# Patient Record
Sex: Female | Born: 1970
Health system: Southern US, Community
[De-identification: ages and names within clinical notes are randomized; demographics above are authoritative.]

## PROBLEM LIST (undated history)

## (undated) DIAGNOSIS — R760 Raised antibody titer: Secondary | ICD-10-CM

## (undated) DIAGNOSIS — K649 Unspecified hemorrhoids: Secondary | ICD-10-CM

## (undated) DIAGNOSIS — R7989 Other specified abnormal findings of blood chemistry: Secondary | ICD-10-CM

## (undated) DIAGNOSIS — Z9889 Other specified postprocedural states: Secondary | ICD-10-CM

## (undated) DIAGNOSIS — R001 Bradycardia, unspecified: Secondary | ICD-10-CM

## (undated) DIAGNOSIS — Z832 Family history of diseases of the blood and blood-forming organs and certain disorders involving the immune mechanism: Secondary | ICD-10-CM

## (undated) DIAGNOSIS — R102 Pelvic and perineal pain unspecified side: Secondary | ICD-10-CM

## (undated) DIAGNOSIS — R87629 Unspecified abnormal cytological findings in specimens from vagina: Secondary | ICD-10-CM

## (undated) HISTORY — DX: Other specified postprocedural states: Z98.890

## (undated) HISTORY — DX: Raised antibody titer: R76.0

## (undated) HISTORY — DX: Unspecified abnormal cytological findings in specimens from vagina: R87.629

## (undated) HISTORY — DX: Family history of diseases of the blood and blood-forming organs and certain disorders involving the immune mechanism: Z83.2

## (undated) HISTORY — DX: Unspecified hemorrhoids: K64.9

## (undated) HISTORY — DX: Bradycardia, unspecified: R00.1

## (undated) HISTORY — DX: Pelvic and perineal pain: R10.2

## (undated) HISTORY — DX: Pelvic and perineal pain unspecified side: R10.20

## (undated) HISTORY — DX: Other specified abnormal findings of blood chemistry: R79.89

## (undated) HISTORY — PX: SHOULDER SURGERY: SHX246

---

## 2005-02-09 ENCOUNTER — Other Ambulatory Visit: Admission: RE | Admit: 2005-02-09 | Discharge: 2005-02-09 | Payer: Self-pay | Admitting: *Deleted

## 2005-02-22 ENCOUNTER — Ambulatory Visit: Payer: Self-pay | Admitting: Oncology

## 2005-03-08 ENCOUNTER — Other Ambulatory Visit: Admission: RE | Admit: 2005-03-08 | Discharge: 2005-03-08 | Payer: Self-pay | Admitting: Family Medicine

## 2005-06-26 ENCOUNTER — Other Ambulatory Visit: Admission: RE | Admit: 2005-06-26 | Discharge: 2005-06-26 | Payer: Self-pay | Admitting: *Deleted

## 2005-10-25 ENCOUNTER — Other Ambulatory Visit: Admission: RE | Admit: 2005-10-25 | Discharge: 2005-10-25 | Payer: Self-pay | Admitting: *Deleted

## 2006-04-04 ENCOUNTER — Other Ambulatory Visit: Admission: RE | Admit: 2006-04-04 | Discharge: 2006-04-04 | Payer: Self-pay | Admitting: *Deleted

## 2006-10-16 ENCOUNTER — Other Ambulatory Visit: Admission: RE | Admit: 2006-10-16 | Discharge: 2006-10-16 | Payer: Self-pay | Admitting: Family Medicine

## 2007-05-19 ENCOUNTER — Other Ambulatory Visit: Admission: RE | Admit: 2007-05-19 | Discharge: 2007-05-19 | Payer: Self-pay | Admitting: Family Medicine

## 2008-03-04 ENCOUNTER — Other Ambulatory Visit: Admission: RE | Admit: 2008-03-04 | Discharge: 2008-03-04 | Payer: Self-pay | Admitting: Family Medicine

## 2009-03-10 ENCOUNTER — Other Ambulatory Visit: Admission: RE | Admit: 2009-03-10 | Discharge: 2009-03-10 | Payer: Self-pay | Admitting: Family Medicine

## 2010-05-24 ENCOUNTER — Encounter: Admission: RE | Admit: 2010-05-24 | Discharge: 2010-05-24 | Payer: Self-pay | Admitting: Family Medicine

## 2011-07-23 ENCOUNTER — Other Ambulatory Visit: Payer: Self-pay | Admitting: Family Medicine

## 2011-07-23 DIAGNOSIS — Z1231 Encounter for screening mammogram for malignant neoplasm of breast: Secondary | ICD-10-CM

## 2011-08-02 ENCOUNTER — Ambulatory Visit
Admission: RE | Admit: 2011-08-02 | Discharge: 2011-08-02 | Disposition: A | Discharge status: Home / Self Care | Payer: BC Managed Care – PPO | Source: Ambulatory Visit | Attending: Family Medicine | Admitting: Family Medicine

## 2011-08-02 DIAGNOSIS — Z1231 Encounter for screening mammogram for malignant neoplasm of breast: Secondary | ICD-10-CM

## 2012-07-08 ENCOUNTER — Other Ambulatory Visit: Payer: Self-pay | Admitting: Family Medicine

## 2012-07-08 DIAGNOSIS — Z1231 Encounter for screening mammogram for malignant neoplasm of breast: Secondary | ICD-10-CM

## 2012-08-04 ENCOUNTER — Ambulatory Visit
Admission: RE | Admit: 2012-08-04 | Discharge: 2012-08-04 | Disposition: A | Discharge status: Home / Self Care | Payer: BC Managed Care – PPO | Source: Ambulatory Visit | Attending: Family Medicine | Admitting: Family Medicine

## 2012-08-04 DIAGNOSIS — Z1231 Encounter for screening mammogram for malignant neoplasm of breast: Secondary | ICD-10-CM

## 2013-07-17 ENCOUNTER — Other Ambulatory Visit: Payer: Self-pay

## 2013-07-17 DIAGNOSIS — Z1231 Encounter for screening mammogram for malignant neoplasm of breast: Secondary | ICD-10-CM

## 2013-08-06 ENCOUNTER — Ambulatory Visit
Admission: RE | Admit: 2013-08-06 | Discharge: 2013-08-06 | Disposition: A | Discharge status: Home / Self Care | Payer: BC Managed Care – PPO | Source: Ambulatory Visit

## 2013-08-06 DIAGNOSIS — Z1231 Encounter for screening mammogram for malignant neoplasm of breast: Secondary | ICD-10-CM

## 2014-01-15 ENCOUNTER — Encounter: Payer: Self-pay | Admitting: *Deleted

## 2014-01-18 ENCOUNTER — Ambulatory Visit (INDEPENDENT_AMBULATORY_CARE_PROVIDER_SITE_OTHER): Payer: BC Managed Care – PPO | Admitting: Family Medicine

## 2014-01-18 VITALS — BP 120/72 | HR 56 | Temp 98.1°F | Resp 16 | Ht 65.75 in | Wt 158.2 lb

## 2014-01-18 DIAGNOSIS — R0982 Postnasal drip: Secondary | ICD-10-CM

## 2014-01-18 DIAGNOSIS — J3089 Other allergic rhinitis: Secondary | ICD-10-CM

## 2014-01-18 MED ORDER — IPRATROPIUM BROMIDE 0.03 % NA SOLN: 2.0000 | Freq: Four times a day (QID) | NASAL | Status: DC

## 2014-01-18 MED ORDER — MONTELUKAST SODIUM 10 MG PO TABS: 10.0000 mg | ORAL_TABLET | Freq: Every day | ORAL | Status: DC

## 2014-01-18 NOTE — Patient Instructions (Signed)
Try some Genteal lubricating eye drops for your dry eyes. The atrovent nasal for post - nasal drip and the singulair for allergies; you can also continue to use claritin or zyrtec.  Let me know if you are not better in the next 7- 10 days; Sooner if worse.

## 2014-01-18 NOTE — Progress Notes (Signed)
[facility] [address], [place] [address] [phone]  Date:  01/18/2014   Name:  Lisa Walsh   DOB:  06-Aug-1970   MRN:  [id number]  PCP:  No PCP Per Patient    Chief Complaint: Sinusitis   History of Present Illness:  Lisa Walsh is a 43 y.o. very pleasant female patient who presents with the following:  She is here today with sinus congestion.  She also has a ST since yesterday.  She thinks this may be from her PND.   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   She has had sinus sx for about one year "off and on."  If she lays down and sleeps better she tends to get more drainage.   She does have a little bit of sneezing.  Nose is a bit runny, and her eyes are watery.   She has on occasional cough over the last 24 hours.   She tried some claritin and mucinex OTC.   No fever or chills, no GI symptoms  She is otherwise generally healthy   There are no active problems to display for this patient.   Past Medical History  Diagnosis Date  . Hemorrhoids   . Pelvic pain in female   . Anticardiolipin antibody positive   . Abnormal Pap smear of vagina     high risk of HPV  . H/O colposcopy with cervical biopsy     CIN 1 dated 9.14.06, normal pap since  . FHx: factor V Leiden mutation     with PEs, DVTs, hers was neg  . Low serum vitamin D     9/13    History reviewed. No pertinent past surgical history.  History  Substance Use Topics  . Smoking status: Never Smoker   . Smokeless tobacco: Not on file  . Alcohol Use: Not on file    Family History  Problem Relation Age of Onset  . Diabetes Father   . Hyperlipidemia Father   . Mental retardation Father     No Known Allergies  Medication list has been reviewed and updated.  No current outpatient prescriptions on file prior to visit.   No current facility-administered medications on file prior to visit.    Review of Systems:  As per HPI- otherwise  negative.   Physical Examination: Filed Vitals:   01/18/14 1423  BP: 120/72  Pulse: 56  Temp: 98.1 F (36.7 C)  Resp: 16   Filed Vitals:   01/18/14 1423  Height: 5' 5.75" (1.67 m)  Weight: 158 lb 3.2 oz (71.759 kg)   Body mass index is 25.73 kg/(m^2). Ideal Body Weight: Weight in (lb) to have BMI = 25: 153.4  GEN: WDWN, NAD, Non-toxic, A & O x 3, loks well HEENT: Atraumatic, Normocephalic. Neck supple. No masses, No LAD.  Bilateral TM wnl, oropharynx normal.  PEERL,EOMI.   Some nasal congestion Ears and Nose: No external deformity. CV: RRR, No M/G/R. No JVD. No thrill. No extra heart sounds. PULM: CTA B, no wheezes, crackles, rhonchi. No retractions. No resp. distress. No accessory muscle use. EXTR: No c/c/e NEURO Normal gait.  PSYCH: Normally interactive. Conversant. Not depressed or anxious appearing.  Calm demeanor.    Assessment and Plan: PND (post-nasal drip) - Plan: ipratropium (ATROVENT) 0.03 % nasal spray  Other allergic rhinitis - Plan: montelukast (SINGULAIR) 10 MG tablet  Treat for PND and allergies as above,  See patient instructions for more details.     Signed Lamar Blinks, MD

## 2014-07-13 ENCOUNTER — Other Ambulatory Visit: Payer: Self-pay

## 2014-07-13 DIAGNOSIS — Z1231 Encounter for screening mammogram for malignant neoplasm of breast: Secondary | ICD-10-CM

## 2014-07-25 DIAGNOSIS — E559 Vitamin D deficiency, unspecified: Secondary | ICD-10-CM | POA: Insufficient documentation

## 2014-08-10 ENCOUNTER — Ambulatory Visit
Admission: RE | Admit: 2014-08-10 | Discharge: 2014-08-10 | Disposition: A | Discharge status: Home / Self Care | Payer: BLUE CROSS/BLUE SHIELD | Source: Ambulatory Visit

## 2014-08-10 DIAGNOSIS — Z1231 Encounter for screening mammogram for malignant neoplasm of breast: Secondary | ICD-10-CM

## 2015-07-29 ENCOUNTER — Other Ambulatory Visit: Payer: Self-pay

## 2015-07-29 DIAGNOSIS — Z1231 Encounter for screening mammogram for malignant neoplasm of breast: Secondary | ICD-10-CM

## 2015-08-17 ENCOUNTER — Ambulatory Visit
Admission: RE | Admit: 2015-08-17 | Discharge: 2015-08-17 | Disposition: A | Discharge status: Home / Self Care | Payer: BLUE CROSS/BLUE SHIELD | Source: Ambulatory Visit

## 2015-08-17 DIAGNOSIS — Z1231 Encounter for screening mammogram for malignant neoplasm of breast: Secondary | ICD-10-CM

## 2015-08-19 ENCOUNTER — Other Ambulatory Visit: Payer: Self-pay | Admitting: Family Medicine

## 2015-08-19 DIAGNOSIS — R928 Other abnormal and inconclusive findings on diagnostic imaging of breast: Secondary | ICD-10-CM

## 2015-08-30 ENCOUNTER — Ambulatory Visit
Admission: RE | Admit: 2015-08-30 | Discharge: 2015-08-30 | Disposition: A | Discharge status: Home / Self Care | Payer: BLUE CROSS/BLUE SHIELD | Source: Ambulatory Visit | Attending: Family Medicine | Admitting: Family Medicine

## 2015-08-30 DIAGNOSIS — R928 Other abnormal and inconclusive findings on diagnostic imaging of breast: Secondary | ICD-10-CM

## 2016-05-14 ENCOUNTER — Ambulatory Visit (INDEPENDENT_AMBULATORY_CARE_PROVIDER_SITE_OTHER): Payer: BLUE CROSS/BLUE SHIELD | Admitting: Family Medicine

## 2016-05-14 VITALS — BP 116/74 | HR 57 | Temp 98.3°F | Resp 17 | Ht 66.0 in | Wt 150.0 lb

## 2016-05-14 DIAGNOSIS — R05 Cough: Secondary | ICD-10-CM

## 2016-05-14 DIAGNOSIS — R059 Cough, unspecified: Secondary | ICD-10-CM

## 2016-05-14 DIAGNOSIS — J22 Unspecified acute lower respiratory infection: Secondary | ICD-10-CM | POA: Diagnosis not present

## 2016-05-14 MED ORDER — AZITHROMYCIN 250 MG PO TABS: ORAL_TABLET | ORAL | 0 refills | Status: DC

## 2016-05-14 NOTE — Progress Notes (Signed)
Subjective:  By signing my name below, I, [name], attest that this documentation has been prepared under the direction and in the presence of Merri Ray, MD Electronically Signed: Ladene Artist, ED Scribe 05/14/2016 at 8:18 AM.   Patient ID: Lisa Walsh, female    DOB: [birth year], 45 y.o.   MRN: [id number]  Chief Complaint  Patient presents with  . Cough    Onset 3 weeks/ productive, green color   HPI  HPI Comments: Lisa Walsh is a 45 y.o. female who presents to the [facility] complaining of gradually improving, persistent cough but thickened mucus over the past 3 weeks. Pt states that mucus was clear/yellow in color but has progressed to green over the past 2 days. She states that cough is worse at night but nasal congestion in worse during the day. Pt has tried CF cough syrup and Mucinex for the past 10 days. She denies fever and sob. No h/o asthma or pulmonary illnesses.   There are no active problems to display for this patient.  Past Medical History:  Diagnosis Date  . Abnormal Pap smear of vagina    high risk of HPV  . Anticardiolipin antibody positive   . FHx: factor V Leiden mutation    with PEs, DVTs, hers was neg  . H/O colposcopy with cervical biopsy    CIN 1 dated 9.14.06, normal pap since  . Hemorrhoids   . Low serum vitamin D    9/13  . Pelvic pain in female    No past surgical history on file. No Known Allergies Prior to Admission medications   Medication Sig Start Date End Date Taking? Authorizing Provider  progesterone (PROMETRIUM) 100 MG capsule Take 100 mg by mouth daily.   Yes Historical Provider, MD  thyroid (ARMOUR) 32.5 MG tablet Take 32.5 mg by mouth daily.   Yes Historical Provider, MD   Social History   Social History  . Marital status: Single    Spouse name: N/A  . Number of children: N/A  . Years of education: N/A   Occupational History  . Not on file.   Social History Main Topics  . Smoking  status: Never Smoker  . Smokeless tobacco: Not on file  . Alcohol use Not on file  . Drug use: Unknown  . Sexual activity: Not on file   Other Topics Concern  . Not on file   Social History Narrative  . No narrative on file   Review of Systems  Constitutional: Negative for fever.  HENT: Positive for congestion.   Respiratory: Positive for cough. Negative for shortness of breath.    Vitals:   05/14/16 0807  BP: 116/74  Pulse: (!) 57  Resp: 17  Temp: 98.3 F (36.8 C)  TempSrc: Oral  SpO2: 100%  Weight: 150 lb (68 kg)  Height: 5\' 6"  (1.676 m)      Objective:   Physical Exam  Constitutional: She is oriented to person, place, and time. She appears well-developed and well-nourished. No distress.  HENT:  Head: Normocephalic and atraumatic.  Right Ear: Hearing, tympanic membrane, external ear and ear canal normal.  Left Ear: Hearing, tympanic membrane, external ear and ear canal normal.  Nose: Nose normal.  Mouth/Throat: Oropharynx is clear and moist. No oropharyngeal exudate.  Eyes: Conjunctivae and EOM are normal. Pupils are equal, round, and reactive to light.  Cardiovascular: Normal rate, regular rhythm, normal heart sounds and intact distal pulses.   No murmur heard.  Pulmonary/Chest: Effort normal and breath sounds normal. No respiratory distress. She has no wheezes. She has no rhonchi.  Lymphadenopathy:    She has no cervical adenopathy.  Neurological: She is alert and oriented to person, place, and time.  Skin: Skin is warm and dry. No rash noted.  Psychiatric: She has a normal mood and affect. Her behavior is normal.  Vitals reviewed.       Assessment & Plan:    Lisa Walsh is a 45 y.o. female Cough - Plan: azithromycin (ZITHROMAX) 250 MG tablet  LRTI (lower respiratory tract infection) - Plan: azithromycin (ZITHROMAX) 250 MG tablet   - start Zpak, mucinex for symptomatic care, rtc precautions.   Meds ordered this encounter                  .  azithromycin (ZITHROMAX) 250 MG tablet    Sig: Take 2 pills by mouth on day 1, then 1 pill by mouth per day on days 2 through 5.    Dispense:  6 tablet    Refill:  0   Patient Instructions   Continue Mucinex or Mucinex DM during the day, cough suppressant at night if needed. Make sure you're drinking plenty of fluids and rest. Start antibiotic for possible bronchitis, see information on that condition below. Return to the clinic or go to the nearest emergency room if any of your symptoms worsen or new symptoms occur.   Acute Bronchitis, Adult Acute bronchitis is when air tubes (bronchi) in the lungs suddenly get swollen. The condition can make it hard to breathe. It can also cause these symptoms:  A cough.  Coughing up clear, yellow, or green mucus.  Wheezing.  Chest congestion.  Shortness of breath.  A fever.  Body aches.  Chills.  A sore throat. Follow these instructions at home: Medicines  Take over-the-counter and prescription medicines only as told by your doctor.  If you were prescribed an antibiotic medicine, take it as told by your doctor. Do not stop taking the antibiotic even if you start to feel better. General instructions  Rest.  Drink enough fluids to keep your pee (urine) clear or pale yellow.  Avoid smoking and secondhand smoke. If you smoke and you need help quitting, ask your doctor. Quitting will help your lungs heal faster.  Use an inhaler, cool mist vaporizer, or humidifier as told by your doctor.  Keep all follow-up visits as told by your doctor. This is important. How is this prevented? To lower your risk of getting this condition again:  Wash your hands often with soap and water. If you cannot use soap and water, use hand sanitizer.  Avoid contact with people who have cold symptoms.  Try not to touch your hands to your mouth, nose, or eyes.  Make sure to get the flu shot every year. Contact a doctor if:  Your symptoms do not get  better in 2 weeks. Get help right away if:  You cough up blood.  You have chest pain.  You have very bad shortness of breath.  You become dehydrated.  You faint (pass out) or keep feeling like you are going to pass out.  You keep throwing up (vomiting).  You have a very bad headache.  Your fever or chills gets worse. This information is not intended to replace advice given to you by your health care provider. Make sure you discuss any questions you have with your health care provider. Document Released: 11/28/2007 Document Revised: 01/18/2016  Document Reviewed: 11/30/2015 Elsevier Interactive Patient Education  2017 [employer].     IF you received an x-ray today, you will receive an invoice from [facility]. Please contact [facility] at [phone] with questions or concerns regarding your invoice.   IF you received labwork today, you will receive an invoice from [employer]. Please contact [facility] at [phone] with questions or concerns regarding your invoice.   Our billing staff will not be able to assist you with questions regarding bills from these companies.  You will be contacted with the lab results as soon as they are available. The fastest way to get your results is to activate your My Chart account. Instructions are located on the last page of this paperwork. If you have not heard from [place] regarding the results in 2 weeks, please contact this office.       I personally performed the services described in this documentation, which was scribed in my presence. The recorded information has been reviewed and considered, and addended by me as needed.   Signed,   Merri Ray, MD [facility].  05/14/16 10:50 PM

## 2016-05-14 NOTE — Patient Instructions (Addendum)
Continue Mucinex or Mucinex DM during the day, cough suppressant at night if needed. Make sure you're drinking plenty of fluids and rest. Start antibiotic for possible bronchitis, see information on that condition below. Return to the clinic or go to the nearest emergency room if any of your symptoms worsen or new symptoms occur.   Acute Bronchitis, Adult Acute bronchitis is when air tubes (bronchi) in the lungs suddenly get swollen. The condition can make it hard to breathe. It can also cause these symptoms:  A cough.  Coughing up clear, yellow, or green mucus.  Wheezing.  Chest congestion.  Shortness of breath.  A fever.  Body aches.  Chills.  A sore throat. Follow these instructions at home: Medicines  Take over-the-counter and prescription medicines only as told by your doctor.  If you were prescribed an antibiotic medicine, take it as told by your doctor. Do not stop taking the antibiotic even if you start to feel better. General instructions  Rest.  Drink enough fluids to keep your pee (urine) clear or pale yellow.  Avoid smoking and secondhand smoke. If you smoke and you need help quitting, ask your doctor. Quitting will help your lungs heal faster.  Use an inhaler, cool mist vaporizer, or humidifier as told by your doctor.  Keep all follow-up visits as told by your doctor. This is important. How is this prevented? To lower your risk of getting this condition again:  Wash your hands often with soap and water. If you cannot use soap and water, use hand sanitizer.  Avoid contact with people who have cold symptoms.  Try not to touch your hands to your mouth, nose, or eyes.  Make sure to get the flu shot every year. Contact a doctor if:  Your symptoms do not get better in 2 weeks. Get help right away if:  You cough up blood.  You have chest pain.  You have very bad shortness of breath.  You become dehydrated.  You faint (pass out) or keep feeling  like you are going to pass out.  You keep throwing up (vomiting).  You have a very bad headache.  Your fever or chills gets worse. This information is not intended to replace advice given to you by your health care provider. Make sure you discuss any questions you have with your health care provider. Document Released: 11/28/2007 Document Revised: 01/18/2016 Document Reviewed: 11/30/2015 Elsevier Interactive Patient Education  2017 [employer].     IF you received an x-ray today, you will receive an invoice from [facility]. Please contact [facility]iology at [phone] with questions or concerns regarding your invoice.   IF you received labwork today, you will receive an invoice from [employer]. Please contact [facility] at [phone] with questions or concerns regarding your invoice.   Our billing staff will not be able to assist you with questions regarding bills from these companies.  You will be contacted with the lab results as soon as they are available. The fastest way to get your results is to activate your My Chart account. Instructions are located on the last page of this paperwork. If you have not heard from [place] regarding the results in 2 weeks, please contact this office.

## 2016-07-26 ENCOUNTER — Other Ambulatory Visit: Payer: Self-pay | Admitting: Family Medicine

## 2016-07-26 DIAGNOSIS — Z1231 Encounter for screening mammogram for malignant neoplasm of breast: Secondary | ICD-10-CM

## 2016-08-15 DIAGNOSIS — R87610 Atypical squamous cells of undetermined significance on cytologic smear of cervix (ASC-US): Secondary | ICD-10-CM | POA: Diagnosis not present

## 2016-08-15 DIAGNOSIS — Z01419 Encounter for gynecological examination (general) (routine) without abnormal findings: Secondary | ICD-10-CM | POA: Diagnosis not present

## 2016-08-15 DIAGNOSIS — Z6824 Body mass index (BMI) 24.0-24.9, adult: Secondary | ICD-10-CM | POA: Diagnosis not present

## 2016-08-20 ENCOUNTER — Ambulatory Visit
Admission: RE | Admit: 2016-08-20 | Discharge: 2016-08-20 | Disposition: A | Discharge status: Home / Self Care | Payer: 59 | Source: Ambulatory Visit | Attending: Family Medicine | Admitting: Family Medicine

## 2016-08-20 DIAGNOSIS — Z1231 Encounter for screening mammogram for malignant neoplasm of breast: Secondary | ICD-10-CM

## 2016-10-01 DIAGNOSIS — Z0000 Encounter for general adult medical examination without abnormal findings: Secondary | ICD-10-CM | POA: Diagnosis not present

## 2016-10-02 DIAGNOSIS — N951 Menopausal and female climacteric states: Secondary | ICD-10-CM | POA: Diagnosis not present

## 2016-10-05 DIAGNOSIS — N951 Menopausal and female climacteric states: Secondary | ICD-10-CM | POA: Diagnosis not present

## 2016-10-05 DIAGNOSIS — E538 Deficiency of other specified B group vitamins: Secondary | ICD-10-CM | POA: Diagnosis not present

## 2017-07-15 ENCOUNTER — Other Ambulatory Visit: Payer: Self-pay | Admitting: Family Medicine

## 2017-07-15 DIAGNOSIS — Z1231 Encounter for screening mammogram for malignant neoplasm of breast: Secondary | ICD-10-CM

## 2017-07-31 DIAGNOSIS — D229 Melanocytic nevi, unspecified: Secondary | ICD-10-CM | POA: Diagnosis not present

## 2017-07-31 DIAGNOSIS — L570 Actinic keratosis: Secondary | ICD-10-CM | POA: Diagnosis not present

## 2017-08-08 DIAGNOSIS — J101 Influenza due to other identified influenza virus with other respiratory manifestations: Secondary | ICD-10-CM | POA: Diagnosis not present

## 2017-08-08 DIAGNOSIS — R52 Pain, unspecified: Secondary | ICD-10-CM | POA: Diagnosis not present

## 2017-08-20 ENCOUNTER — Encounter: Payer: Self-pay | Admitting: Obstetrics & Gynecology

## 2017-08-20 ENCOUNTER — Ambulatory Visit: Payer: 59 | Admitting: Obstetrics & Gynecology

## 2017-08-20 VITALS — BP 128/78 | Ht 65.5 in | Wt 147.0 lb

## 2017-08-20 DIAGNOSIS — Z01419 Encounter for gynecological examination (general) (routine) without abnormal findings: Secondary | ICD-10-CM | POA: Diagnosis not present

## 2017-08-20 DIAGNOSIS — E039 Hypothyroidism, unspecified: Secondary | ICD-10-CM

## 2017-08-20 DIAGNOSIS — N914 Secondary oligomenorrhea: Secondary | ICD-10-CM

## 2017-08-20 MED ORDER — THYROID 32.5 MG PO TABS: 32.5000 mg | ORAL_TABLET | Freq: Every day | ORAL | 4 refills | Status: DC

## 2017-08-20 NOTE — Progress Notes (Signed)
Lisa Walsh Jan 25, 1971 [id number]   History:    47 y.o. same sex partner.  Patient is a Clinical biochemist, doing very well.    RP: Established patient presenting for annual gyn exam   HPI: Her menstrual periods have been mostly every months and light.  3 months ago had mild vaginal bleeding at 2 weeks interval.  Last menstrual period light flow on June 26, 2017.  No hot flashes and no night sweats.  No pelvic pain.  Not on any hormonal therapy.  Does have hypothyroidism on Armour thyroid 32.5 mg daily for which she needs a re-prescription.  Fasting today for health labs here.  Exercises regularly.  Body mass index 24.09.    Past medical history,surgical history, family history and social history were all reviewed and documented in the EPIC chart.  Gynecologic History Patient's last menstrual period was 06/26/2017. Contraception: Same sex partner Last Pap: 07/2016. Results were: Negative Last mammogram: 07/2016. Results were: Negative.  [facility] Bone Density: Never Colonoscopy: Never  Obstetric History OB History  Gravida Para Term Preterm AB Living  0 0 0 0 0 0  SAB TAB Ectopic Multiple Live Births  0 0 0 0 0         ROS: A ROS was performed and pertinent positives and negatives are included in the history.  GENERAL: No fevers or chills. HEENT: No change in vision, no earache, sore throat or sinus congestion. NECK: No pain or stiffness. CARDIOVASCULAR: No chest pain or pressure. No palpitations. PULMONARY: No shortness of breath, cough or wheeze. GASTROINTESTINAL: No abdominal pain, nausea, vomiting or diarrhea, melena or bright red blood per rectum. GENITOURINARY: No urinary frequency, urgency, hesitancy or dysuria. MUSCULOSKELETAL: No joint or muscle pain, no back pain, no recent trauma. DERMATOLOGIC: No rash, no itching, no lesions. ENDOCRINE: No polyuria, polydipsia, no heat or cold intolerance. No recent change in weight. HEMATOLOGICAL:  No anemia or easy bruising or bleeding. NEUROLOGIC: No headache, seizures, numbness, tingling or weakness. PSYCHIATRIC: No depression, no loss of interest in normal activity or change in sleep pattern.     Exam:   BP 128/78   Ht 5' 5.5" (1.664 m)   Wt 147 lb (66.7 kg)   LMP 06/26/2017   BMI 24.09 kg/m   Body mass index is 24.09 kg/m.  General appearance : Well developed well nourished female. No acute distress HEENT: Eyes: no retinal hemorrhage or exudates,  Neck supple, trachea midline, no carotid bruits, no thyroidmegaly Lungs: Clear to auscultation, no rhonchi or wheezes, or rib retractions  Heart: Regular rate and rhythm, no murmurs or gallops Breast:Examined in sitting and supine position were symmetrical in appearance, no palpable masses or tenderness,  no skin retraction, no nipple inversion, no nipple discharge, no skin discoloration, no axillary or supraclavicular lymphadenopathy Abdomen: no palpable masses or tenderness, no rebound or guarding Extremities: no edema or skin discoloration or tenderness  Pelvic: Vulva: Normal             Vagina: No gross lesions or discharge  Cervix: No gross lesions or discharge.  Pap reflex done.  Uterus  RV, normal size, shape and consistency, non-tender and mobile  Adnexa  Without masses or tenderness  Anus: Normal   Assessment/Plan:  47 y.o. female for annual exam   1. Encounter for routine gynecological examination with Papanicolaou smear of cervix Normal gynecologic exam.  Pap reflex done today.  Breast exam normal.  Screening mammogram already scheduled this  week at the breast center.  Fasting health labs here today. - CBC - Comp Met (CMET) - Lipid panel - TSH - Vitamin D 1,25 dihydroxy  2. Secondary oligomenorrhea Just skipped 1 period so far.  Possible perimenopausal anovulation.  Will observe.  Patient will call if no period x 3 months or if heavy abnormal pattern of vaginal bleeding.  Will reevaluate at that time per  clinical information.  Cyclic progestin for perimenopausal anovulation discussed.  3. Acquired hypothyroidism Checking TSH today.  Thyroid (Armour) 32.5 mg tablet daily represcribed.  Other orders - thyroid (ARMOUR) 32.5 MG tablet; Take 1 tablet (32.5 mg total) by mouth daily.  Counseling on above issues more than 50% for 10 minutes.  Princess Bruins MD, 9:35 AM 08/20/2017

## 2017-08-20 NOTE — Patient Instructions (Signed)
1. Encounter for routine gynecological examination with Papanicolaou smear of cervix Normal gynecologic exam.  Pap reflex done today.  Breast exam normal.  Screening mammogram already scheduled this week at the breast center.  Fasting health labs here today. - CBC - Comp Met (CMET) - Lipid panel - TSH - Vitamin D 1,25 dihydroxy  2. Secondary oligomenorrhea Just skipped 1 period so far.  Possible perimenopausal anovulation.  Will observe.  Patient will call if no period x 3 months or if heavy abnormal pattern of vaginal bleeding.  Will reevaluate at that time per clinical information.  Cyclic progestin for perimenopausal anovulation discussed.  3. Acquired hypothyroidism Checking TSH today.  Thyroid (Armour) 32.5 mg tablet daily represcribed.  Other orders - thyroid (ARMOUR) 32.5 MG tablet; Take 1 tablet (32.5 mg total) by mouth daily.  Lisa Walsh, it was a pleasure seeing you today!  I will inform you of all your results as soon as they are available.  Health Maintenance, Female Adopting a healthy lifestyle and getting preventive care can go a long way to promote health and wellness. Talk with your health care provider about what schedule of regular examinations is right for you. This is a good chance for you to check in with your provider about disease prevention and staying healthy. In between checkups, there are plenty of things you can do on your own. Experts have done a lot of research about which lifestyle changes and preventive measures are most likely to keep you healthy. Ask your health care provider for more information. Weight and diet Eat a healthy diet  Be sure to include plenty of vegetables, fruits, low-fat dairy products, and lean protein.  Do not eat a lot of foods high in solid fats, added sugars, or salt.  Get regular exercise. This is one of the most important things you can do for your health. ? Most adults should exercise for at least 150 minutes each week. The exercise  should increase your heart rate and make you sweat (moderate-intensity exercise). ? Most adults should also do strengthening exercises at least twice a week. This is in addition to the moderate-intensity exercise.  Maintain a healthy weight  Body mass index (BMI) is a measurement that can be used to identify possible weight problems. It estimates body fat based on height and weight. Your health care provider can help determine your BMI and help you achieve or maintain a healthy weight.  For females 30 years of age and older: ? A BMI below 18.5 is considered underweight. ? A BMI of 18.5 to 24.9 is normal. ? A BMI of 25 to 29.9 is considered overweight. ? A BMI of 30 and above is considered obese.  Watch levels of cholesterol and blood lipids  You should start having your blood tested for lipids and cholesterol at 46 years of age, then have this test every 5 years.  You may need to have your cholesterol levels checked more often if: ? Your lipid or cholesterol levels are high. ? You are older than 46 years of age. ? You are at high risk for heart disease.  Cancer screening Lung Cancer  Lung cancer screening is recommended for adults 48-71 years old who are at high risk for lung cancer because of a history of smoking.  A yearly low-dose CT scan of the lungs is recommended for people who: ? Currently smoke. ? Have quit within the past 15 years. ? Have at least a 30-pack-year history of smoking. A pack year  is smoking an average of one pack of cigarettes a day for 1 year.  Yearly screening should continue until it has been 15 years since you quit.  Yearly screening should stop if you develop a health problem that would prevent you from having lung cancer treatment.  Breast Cancer  Practice breast self-awareness. This means understanding how your breasts normally appear and feel.  It also means doing regular breast self-exams. Let your health care provider know about any changes, no  matter how small.  If you are in your 20s or 30s, you should have a clinical breast exam (CBE) by a health care provider every 1-3 years as part of a regular health exam.  If you are 33 or older, have a CBE every year. Also consider having a breast X-ray (mammogram) every year.  If you have a family history of breast cancer, talk to your health care provider about genetic screening.  If you are at high risk for breast cancer, talk to your health care provider about having an MRI and a mammogram every year.  Breast cancer gene (BRCA) assessment is recommended for women who have family members with BRCA-related cancers. BRCA-related cancers include: ? Breast. ? Ovarian. ? Tubal. ? Peritoneal cancers.  Results of the assessment will determine the need for genetic counseling and BRCA1 and BRCA2 testing.  Cervical Cancer Your health care provider may recommend that you be screened regularly for cancer of the pelvic organs (ovaries, uterus, and vagina). This screening involves a pelvic examination, including checking for microscopic changes to the surface of your cervix (Pap test). You may be encouraged to have this screening done every 3 years, beginning at age 29.  For women ages 52-65, health care providers may recommend pelvic exams and Pap testing every 3 years, or they may recommend the Pap and pelvic exam, combined with testing for human papilloma virus (HPV), every 5 years. Some types of HPV increase your risk of cervical cancer. Testing for HPV may also be done on women of any age with unclear Pap test results.  Other health care providers may not recommend any screening for nonpregnant women who are considered low risk for pelvic cancer and who do not have symptoms. Ask your health care provider if a screening pelvic exam is right for you.  If you have had past treatment for cervical cancer or a condition that could lead to cancer, you need Pap tests and screening for cancer for at least  20 years after your treatment. If Pap tests have been discontinued, your risk factors (such as having a new sexual partner) need to be reassessed to determine if screening should resume. Some women have medical problems that increase the chance of getting cervical cancer. In these cases, your health care provider may recommend more frequent screening and Pap tests.  Colorectal Cancer  This type of cancer can be detected and often prevented.  Routine colorectal cancer screening usually begins at 46 years of age and continues through 46 years of age.  Your health care provider may recommend screening at an earlier age if you have risk factors for colon cancer.  Your health care provider may also recommend using home test kits to check for hidden blood in the stool.  A small camera at the end of a tube can be used to examine your colon directly (sigmoidoscopy or colonoscopy). This is done to check for the earliest forms of colorectal cancer.  Routine screening usually begins at age 66.  Direct  examination of the colon should be repeated every 5-10 years through 46 years of age. However, you may need to be screened more often if early forms of precancerous polyps or small growths are found.  Skin Cancer  Check your skin from head to toe regularly.  Tell your health care provider about any new moles or changes in moles, especially if there is a change in a mole's shape or color.  Also tell your health care provider if you have a mole that is larger than the size of a pencil eraser.  Always use sunscreen. Apply sunscreen liberally and repeatedly throughout the day.  Protect yourself by wearing long sleeves, pants, a wide-brimmed hat, and sunglasses whenever you are outside.  Heart disease, diabetes, and high blood pressure  High blood pressure causes heart disease and increases the risk of stroke. High blood pressure is more likely to develop in: ? People who have blood pressure in the  high end of the normal range (130-139/85-89 mm Hg). ? People who are overweight or obese. ? People who are African American.  If you are 23-88 years of age, have your blood pressure checked every 3-5 years. If you are 59 years of age or older, have your blood pressure checked every year. You should have your blood pressure measured twice-once when you are at a hospital or clinic, and once when you are not at a hospital or clinic. Record the average of the two measurements. To check your blood pressure when you are not at a hospital or clinic, you can use: ? An automated blood pressure machine at a pharmacy. ? A home blood pressure monitor.  If you are between 58 years and 74 years old, ask your health care provider if you should take aspirin to prevent strokes.  Have regular diabetes screenings. This involves taking a blood sample to check your fasting blood sugar level. ? If you are at a normal weight and have a low risk for diabetes, have this test once every three years after 46 years of age. ? If you are overweight and have a high risk for diabetes, consider being tested at a younger age or more often. Preventing infection Hepatitis B  If you have a higher risk for hepatitis B, you should be screened for this virus. You are considered at high risk for hepatitis B if: ? You were born in a country where hepatitis B is common. Ask your health care provider which countries are considered high risk. ? Your parents were born in a high-risk country, and you have not been immunized against hepatitis B (hepatitis B vaccine). ? You have HIV or AIDS. ? You use needles to inject street drugs. ? You live with someone who has hepatitis B. ? You have had sex with someone who has hepatitis B. ? You get hemodialysis treatment. ? You take certain medicines for conditions, including cancer, organ transplantation, and autoimmune conditions.  Hepatitis C  Blood testing is recommended for: ? Everyone born  from [birth year] through 1964. ? Anyone with known risk factors for hepatitis C.  Sexually transmitted infections (STIs)  You should be screened for sexually transmitted infections (STIs) including gonorrhea and chlamydia if: ? You are sexually active and are younger than 46 years of age. ? You are older than 46 years of age and your health care provider tells you that you are at risk for this type of infection. ? Your sexual activity has changed since you were last screened and you  are at an increased risk for chlamydia or gonorrhea. Ask your health care provider if you are at risk.  If you do not have HIV, but are at risk, it may be recommended that you take a prescription medicine daily to prevent HIV infection. This is called pre-exposure prophylaxis (PrEP). You are considered at risk if: ? You are sexually active and do not regularly use condoms or know the HIV status of your partner(s). ? You take drugs by injection. ? You are sexually active with a partner who has HIV.  Talk with your health care provider about whether you are at high risk of being infected with HIV. If you choose to begin PrEP, you should first be tested for HIV. You should then be tested every 3 months for as long as you are taking PrEP. Pregnancy  If you are premenopausal and you may become pregnant, ask your health care provider about preconception counseling.  If you may become pregnant, take 400 to 800 micrograms (mcg) of folic acid every day.  If you want to prevent pregnancy, talk to your health care provider about birth control (contraception). Osteoporosis and menopause  Osteoporosis is a disease in which the bones lose minerals and strength with aging. This can result in serious bone fractures. Your risk for osteoporosis can be identified using a bone density scan.  If you are 47 years of age or older, or if you are at risk for osteoporosis and fractures, ask your health care provider if you should be  screened.  Ask your health care provider whether you should take a calcium or vitamin D supplement to lower your risk for osteoporosis.  Menopause may have certain physical symptoms and risks.  Hormone replacement therapy may reduce some of these symptoms and risks. Talk to your health care provider about whether hormone replacement therapy is right for you. Follow these instructions at home:  Schedule regular health, dental, and eye exams.  Stay current with your immunizations.  Do not use any tobacco products including cigarettes, chewing tobacco, or electronic cigarettes.  If you are pregnant, do not drink alcohol.  If you are breastfeeding, limit how much and how often you drink alcohol.  Limit alcohol intake to no more than 1 drink per day for nonpregnant women. One drink equals 12 ounces of beer, 5 ounces of wine, or 1 ounces of hard liquor.  Do not use street drugs.  Do not share needles.  Ask your health care provider for help if you need support or information about quitting drugs.  Tell your health care provider if you often feel depressed.  Tell your health care provider if you have ever been abused or do not feel safe at home. This information is not intended to replace advice given to you by your health care provider. Make sure you discuss any questions you have with your health care provider. Document Released: 12/25/2010 Document Revised: 11/17/2015 Document Reviewed: 03/15/2015 Elsevier Interactive Patient Education  [employer].

## 2017-08-20 NOTE — Addendum Note (Signed)
Addended by: Thurnell Garbe A on: 08/20/2017 11:05 AM   Modules accepted: Orders

## 2017-08-22 ENCOUNTER — Ambulatory Visit
Admission: RE | Admit: 2017-08-22 | Discharge: 2017-08-22 | Disposition: A | Discharge status: Home / Self Care | Payer: 59 | Source: Ambulatory Visit | Attending: Family Medicine | Admitting: Family Medicine

## 2017-08-22 DIAGNOSIS — Z1231 Encounter for screening mammogram for malignant neoplasm of breast: Secondary | ICD-10-CM | POA: Diagnosis not present

## 2017-08-23 LAB — COMPREHENSIVE METABOLIC PANEL
AG Ratio: 1.6 (calc) (ref 1.0–2.5)
ALKALINE PHOSPHATASE (APISO): 40 U/L (ref 33–115)
ALT: 13 U/L (ref 6–29)
AST: 16 U/L (ref 10–35)
Albumin: 4.3 g/dL (ref 3.6–5.1)
BUN: 19 mg/dL (ref 7–25)
CHLORIDE: 102 mmol/L (ref 98–110)
CO2: 26 mmol/L (ref 20–32)
CREATININE: 0.78 mg/dL (ref 0.50–1.10)
Calcium: 9.6 mg/dL (ref 8.6–10.2)
GLOBULIN: 2.7 g/dL (ref 1.9–3.7)
Glucose, Bld: 93 mg/dL (ref 65–99)
Potassium: 4.6 mmol/L (ref 3.5–5.3)
Sodium: 136 mmol/L (ref 135–146)
Total Bilirubin: 1 mg/dL (ref 0.2–1.2)
Total Protein: 7 g/dL (ref 6.1–8.1)

## 2017-08-23 LAB — PAP IG W/ RFLX HPV ASCU

## 2017-08-23 LAB — CBC
HCT: 36.7 % (ref 35.0–45.0)
Hemoglobin: 12.4 g/dL (ref 11.7–15.5)
MCH: 30.8 pg (ref 27.0–33.0)
MCHC: 33.8 g/dL (ref 32.0–36.0)
MCV: 91.3 fL (ref 80.0–100.0)
MPV: 10 fL (ref 7.5–12.5)
PLATELETS: 320 10*3/uL (ref 140–400)
RBC: 4.02 10*6/uL (ref 3.80–5.10)
RDW: 11.8 % (ref 11.0–15.0)
WBC: 6.2 10*3/uL (ref 3.8–10.8)

## 2017-08-23 LAB — LIPID PANEL
Cholesterol: 186 mg/dL (ref <200)
HDL: 69 mg/dL (ref >50)
LDL Cholesterol (Calc): 102 mg/dL (calc) — ABNORMAL HIGH
Non-HDL Cholesterol (Calc): 117 mg/dL (calc) (ref <130)
Total CHOL/HDL Ratio: 2.7 (calc) (ref <5.0)
Triglycerides: 68 mg/dL (ref <150)

## 2017-08-23 LAB — VITAMIN D 1,25 DIHYDROXY
VITAMIN D 1, 25 (OH) TOTAL: 55 pg/mL (ref 18–72)
VITAMIN D3 1, 25 (OH): 55 pg/mL

## 2017-08-23 LAB — TSH: TSH: 1.55 mIU/L

## 2017-08-26 ENCOUNTER — Telehealth: Payer: Self-pay

## 2017-08-26 NOTE — Telephone Encounter (Signed)
Agree with Petra Kuba Thyroid 32.5 mg daily to be refilled until next annual/gyn exam.

## 2017-08-26 NOTE — Telephone Encounter (Addendum)
Patient said the Thyroid 32.5 Rx you sent for her is on back order and pharmacy will not be able to get it until April. They told her [facility] might have it.  I called [facility] and spoke with Pharmacist. She said that Armor Thyroid does not come in 32.5 mg. She said it comes 15,30,45,60, etc.  She said if the dose is 32.5 mg then what she thinks you want is Petra Kuba Throid 32.5 mg and they do have that in stock.  Please advise.  (reminder to self to send Metrogel Rx wherever you end up thyroid Rx)

## 2017-08-27 MED ORDER — METRONIDAZOLE 0.75 % VA GEL: VAGINAL | 0 refills | Status: DC

## 2017-08-27 MED ORDER — THYROID 32.5 MG PO TABS: 32.5000 mg | ORAL_TABLET | Freq: Every day | ORAL | 4 refills | Status: DC

## 2017-08-27 NOTE — Telephone Encounter (Signed)
Spoke with patient and at her request Metrogel and the thyroid med sent to [facility].

## 2018-06-05 DIAGNOSIS — L570 Actinic keratosis: Secondary | ICD-10-CM | POA: Diagnosis not present

## 2018-07-30 ENCOUNTER — Other Ambulatory Visit: Payer: Self-pay | Admitting: Family Medicine

## 2018-07-30 DIAGNOSIS — Z1231 Encounter for screening mammogram for malignant neoplasm of breast: Secondary | ICD-10-CM

## 2018-08-14 DIAGNOSIS — L570 Actinic keratosis: Secondary | ICD-10-CM | POA: Diagnosis not present

## 2018-08-14 DIAGNOSIS — B079 Viral wart, unspecified: Secondary | ICD-10-CM | POA: Diagnosis not present

## 2018-08-27 ENCOUNTER — Ambulatory Visit
Admission: RE | Admit: 2018-08-27 | Discharge: 2018-08-27 | Disposition: A | Discharge status: Home / Self Care | Payer: 59 | Source: Ambulatory Visit | Attending: Family Medicine | Admitting: Family Medicine

## 2018-08-27 DIAGNOSIS — Z1231 Encounter for screening mammogram for malignant neoplasm of breast: Secondary | ICD-10-CM

## 2018-09-05 ENCOUNTER — Encounter: Payer: 59 | Admitting: Obstetrics & Gynecology

## 2019-03-04 ENCOUNTER — Encounter: Payer: 59 | Admitting: Obstetrics & Gynecology

## 2019-04-14 ENCOUNTER — Ambulatory Visit: Payer: 59 | Admitting: Obstetrics & Gynecology

## 2019-04-14 ENCOUNTER — Other Ambulatory Visit: Payer: Self-pay

## 2019-04-14 ENCOUNTER — Encounter: Payer: Self-pay | Admitting: Obstetrics & Gynecology

## 2019-04-14 VITALS — BP 126/80 | Ht 65.5 in | Wt 147.0 lb

## 2019-04-14 DIAGNOSIS — Z01419 Encounter for gynecological examination (general) (routine) without abnormal findings: Secondary | ICD-10-CM | POA: Diagnosis not present

## 2019-04-14 NOTE — Patient Instructions (Signed)
1. Encounter for routine gynecological examination with Papanicolaou smear of cervix Normal gynecologic exam.  Pap test February 2019 was negative, but no transition zone cells were present, Pap reflex done today.  No need for contraception as patient is with a same-sex partner.  Breast exam normal.  Screening mammogram March 2020 was negative.  Good body mass index at 24.09.  Continue with fitness and healthy nutrition.  Follow-up here for fasting health labs. - CBC; Future - Comp Met (CMET); Future - TSH; Future - VITAMIN D 25 Hydroxy (Vit-D Deficiency, Fractures); Future - Lipid panel; Future  Lisa Walsh, it was a pleasure seeing you today!  I will inform you of your results as soon as they are available.

## 2019-04-14 NOTE — Addendum Note (Signed)
Addended by: Thurnell Garbe A on: 04/14/2019 03:03 PM   Modules accepted: Orders

## 2019-04-14 NOTE — Progress Notes (Signed)
Lisa Walsh 1971/01/19 [id number]   History:    48 y.o. G0 same sex partner, Marlowe Kays.  Patient is a Clinical biochemist, doing very well.    RP: Established patient presenting for annual gyn exam   HPI: Her menstrual periods have been every 3-4 weeks and light.  No BTB.  No hot flashes and no night sweats.  No pelvic pain.  Does have hypothyroidism on Armour thyroid 32.5 mg daily for which she needs a re-prescription.  Will f/u for health labs here.  Exercises regularly.  Body mass index 24.09.  Past medical history,surgical history, family history and social history were all reviewed and documented in the EPIC chart.  Gynecologic History Patient's last menstrual period was 04/09/2019. Contraception: Same sex partner Last Pap: 07/2017. Results were: Negative (No TZ cells) Last mammogram: 08/2018. Results were: Negative Bone Density: Never Colonoscopy: Never  Obstetric History OB History  Gravida Para Term Preterm AB Living  0 0 0 0 0 0  SAB TAB Ectopic Multiple Live Births  0 0 0 0 0     ROS: A ROS was performed and pertinent positives and negatives are included in the history.  GENERAL: No fevers or chills. HEENT: No change in vision, no earache, sore throat or sinus congestion. NECK: No pain or stiffness. CARDIOVASCULAR: No chest pain or pressure. No palpitations. PULMONARY: No shortness of breath, cough or wheeze. GASTROINTESTINAL: No abdominal pain, nausea, vomiting or diarrhea, melena or bright red blood per rectum. GENITOURINARY: No urinary frequency, urgency, hesitancy or dysuria. MUSCULOSKELETAL: No joint or muscle pain, no back pain, no recent trauma. DERMATOLOGIC: No rash, no itching, no lesions. ENDOCRINE: No polyuria, polydipsia, no heat or cold intolerance. No recent change in weight. HEMATOLOGICAL: No anemia or easy bruising or bleeding. NEUROLOGIC: No headache, seizures, numbness, tingling or weakness. PSYCHIATRIC: No depression, no loss of interest in normal  activity or change in sleep pattern.     Exam:   BP 126/80    Ht 5' 5.5" (1.664 m)    Wt 147 lb (66.7 kg)    LMP 04/09/2019    BMI 24.09 kg/m   Body mass index is 24.09 kg/m.  General appearance : Well developed well nourished female. No acute distress HEENT: Eyes: no retinal hemorrhage or exudates,  Neck supple, trachea midline, no carotid bruits, no thyroidmegaly Lungs: Clear to auscultation, no rhonchi or wheezes, or rib retractions  Heart: Regular rate and rhythm, no murmurs or gallops Breast:Examined in sitting and supine position were symmetrical in appearance, no palpable masses or tenderness,  no skin retraction, no nipple inversion, no nipple discharge, no skin discoloration, no axillary or supraclavicular lymphadenopathy Abdomen: no palpable masses or tenderness, no rebound or guarding Extremities: no edema or skin discoloration or tenderness  Pelvic: Vulva: Normal             Vagina: No gross lesions or discharge  Cervix: No gross lesions or discharge.  Pap reflex done.  Uterus  AV, normal size, shape and consistency, non-tender and mobile  Adnexa  Without masses or tenderness  Anus: Normal   Assessment/Plan:  49 y.o. female for annual exam   1. Encounter for routine gynecological examination with Papanicolaou smear of cervix Normal gynecologic exam.  Pap test February 2019 was negative, but no transition zone cells were present, Pap reflex done today.  No need for contraception as patient is with a same-sex partner.  Breast exam normal.  Screening mammogram March 2020 was negative.  Good body  mass index at 24.09.  Continue with fitness and healthy nutrition.  Follow-up here for fasting health labs. - CBC; Future - Comp Met (CMET); Future - TSH; Future - VITAMIN D 25 Hydroxy (Vit-D Deficiency, Fractures); Future - Lipid panel; Future   Princess Bruins MD, 12:45 PM 04/14/2019

## 2019-04-15 ENCOUNTER — Other Ambulatory Visit: Payer: 59

## 2019-04-15 DIAGNOSIS — Z01419 Encounter for gynecological examination (general) (routine) without abnormal findings: Secondary | ICD-10-CM

## 2019-04-15 LAB — PAP IG W/ RFLX HPV ASCU

## 2019-04-15 LAB — COMPREHENSIVE METABOLIC PANEL
AG Ratio: 1.8 (calc) (ref 1.0–2.5)
ALT: 14 U/L (ref 6–29)
AST: 15 U/L (ref 10–35)
Albumin: 4 g/dL (ref 3.6–5.1)
Alkaline phosphatase (APISO): 41 U/L (ref 31–125)
BUN: 16 mg/dL (ref 7–25)
CO2: 29 mmol/L (ref 20–32)
Calcium: 9.4 mg/dL (ref 8.6–10.2)
Chloride: 105 mmol/L (ref 98–110)
Creat: 0.8 mg/dL (ref 0.50–1.10)
Globulin: 2.2 g/dL (calc) (ref 1.9–3.7)
Glucose, Bld: 100 mg/dL — ABNORMAL HIGH (ref 65–99)
Potassium: 4.6 mmol/L (ref 3.5–5.3)
Sodium: 139 mmol/L (ref 135–146)
Total Bilirubin: 0.7 mg/dL (ref 0.2–1.2)
Total Protein: 6.2 g/dL (ref 6.1–8.1)

## 2019-04-15 LAB — LIPID PANEL
Cholesterol: 181 mg/dL (ref <200)
HDL: 74 mg/dL (ref >=50)
LDL Cholesterol (Calc): 94 mg/dL (calc)
Non-HDL Cholesterol (Calc): 107 mg/dL (calc) (ref <130)
Total CHOL/HDL Ratio: 2.4 (calc) (ref <5.0)
Triglycerides: 45 mg/dL (ref <150)

## 2019-04-15 LAB — CBC
HCT: 36.7 % (ref 35.0–45.0)
Hemoglobin: 12.3 g/dL (ref 11.7–15.5)
MCH: 31.5 pg (ref 27.0–33.0)
MCHC: 33.5 g/dL (ref 32.0–36.0)
MCV: 93.9 fL (ref 80.0–100.0)
MPV: 9.9 fL (ref 7.5–12.5)
Platelets: 275 10*3/uL (ref 140–400)
RBC: 3.91 10*6/uL (ref 3.80–5.10)
RDW: 11.5 % (ref 11.0–15.0)
WBC: 3.3 10*3/uL — ABNORMAL LOW (ref 3.8–10.8)

## 2019-04-15 LAB — VITAMIN D 25 HYDROXY (VIT D DEFICIENCY, FRACTURES): Vit D, 25-Hydroxy: 25 ng/mL — ABNORMAL LOW (ref 30–100)

## 2019-04-15 LAB — TSH: TSH: 2.25 mIU/L

## 2019-04-22 ENCOUNTER — Other Ambulatory Visit: Payer: Self-pay

## 2019-04-22 DIAGNOSIS — E559 Vitamin D deficiency, unspecified: Secondary | ICD-10-CM

## 2019-04-22 DIAGNOSIS — D708 Other neutropenia: Secondary | ICD-10-CM

## 2019-04-24 ENCOUNTER — Other Ambulatory Visit: Payer: Self-pay

## 2019-04-24 DIAGNOSIS — E559 Vitamin D deficiency, unspecified: Secondary | ICD-10-CM

## 2019-04-24 MED ORDER — VITAMIN D (ERGOCALCIFEROL) 1.25 MG (50000 UNIT) PO CAPS: 50000.0000 [IU] | ORAL_CAPSULE | ORAL | 0 refills | Status: DC

## 2019-05-25 ENCOUNTER — Other Ambulatory Visit: Payer: Self-pay

## 2019-05-25 ENCOUNTER — Other Ambulatory Visit: Payer: 59

## 2019-05-25 DIAGNOSIS — D708 Other neutropenia: Secondary | ICD-10-CM

## 2019-05-25 LAB — CBC WITH DIFFERENTIAL/PLATELET
Absolute Monocytes: 473 cells/uL (ref 200–950)
Basophils Absolute: 32 cells/uL (ref 0–200)
Basophils Relative: 0.5 %
Eosinophils Absolute: 208 cells/uL (ref 15–500)
Eosinophils Relative: 3.3 %
HCT: 40 % (ref 35.0–45.0)
Hemoglobin: 13.4 g/dL (ref 11.7–15.5)
Lymphs Abs: 1544 cells/uL (ref 850–3900)
MCH: 31.8 pg (ref 27.0–33.0)
MCHC: 33.5 g/dL (ref 32.0–36.0)
MCV: 94.8 fL (ref 80.0–100.0)
MPV: 10.3 fL (ref 7.5–12.5)
Monocytes Relative: 7.5 %
Neutro Abs: 4045 cells/uL (ref 1500–7800)
Neutrophils Relative %: 64.2 %
Platelets: 283 10*3/uL (ref 140–400)
RBC: 4.22 10*6/uL (ref 3.80–5.10)
RDW: 11.5 % (ref 11.0–15.0)
Total Lymphocyte: 24.5 %
WBC: 6.3 10*3/uL (ref 3.8–10.8)

## 2019-07-17 ENCOUNTER — Other Ambulatory Visit: Payer: Self-pay

## 2019-07-21 ENCOUNTER — Other Ambulatory Visit: Payer: Self-pay | Admitting: Family Medicine

## 2019-07-21 DIAGNOSIS — Z1231 Encounter for screening mammogram for malignant neoplasm of breast: Secondary | ICD-10-CM

## 2019-07-23 ENCOUNTER — Other Ambulatory Visit: Payer: Self-pay

## 2019-07-23 ENCOUNTER — Other Ambulatory Visit: Payer: 59

## 2019-07-23 DIAGNOSIS — E559 Vitamin D deficiency, unspecified: Secondary | ICD-10-CM

## 2019-07-24 LAB — VITAMIN D 25 HYDROXY (VIT D DEFICIENCY, FRACTURES): Vit D, 25-Hydroxy: 44 ng/mL (ref 30–100)

## 2019-09-02 ENCOUNTER — Ambulatory Visit
Admission: RE | Admit: 2019-09-02 | Discharge: 2019-09-02 | Disposition: A | Discharge status: Home / Self Care | Payer: 59 | Source: Ambulatory Visit | Attending: Family Medicine | Admitting: Family Medicine

## 2019-09-02 ENCOUNTER — Other Ambulatory Visit: Payer: Self-pay

## 2019-09-02 DIAGNOSIS — Z1231 Encounter for screening mammogram for malignant neoplasm of breast: Secondary | ICD-10-CM

## 2020-04-14 ENCOUNTER — Ambulatory Visit (INDEPENDENT_AMBULATORY_CARE_PROVIDER_SITE_OTHER): Payer: 59 | Admitting: Obstetrics & Gynecology

## 2020-04-14 ENCOUNTER — Other Ambulatory Visit: Payer: Self-pay

## 2020-04-14 ENCOUNTER — Encounter: Payer: Self-pay | Admitting: Obstetrics & Gynecology

## 2020-04-14 VITALS — BP 130/80 | Ht 65.0 in | Wt 147.0 lb

## 2020-04-14 DIAGNOSIS — Z01419 Encounter for gynecological examination (general) (routine) without abnormal findings: Secondary | ICD-10-CM | POA: Diagnosis not present

## 2020-04-14 DIAGNOSIS — Z23 Encounter for immunization: Secondary | ICD-10-CM | POA: Diagnosis not present

## 2020-04-14 NOTE — Progress Notes (Signed)
Lisa Walsh [birth year] [id number]   History:    49 y.o. G0 same sex partner, Marlowe Kays. Patient is a Clinical biochemist, doing very well.   [id number] presenting for annual gyn exam   HPI:Her menstrual periods have been every 3-4 weeks and light.  No BTB. No hot flashes and no night sweats. No pelvic pain. Does have hypothyroidism on Armourthyroid 32.5 mg daily for which she needs a re-prescription.  Will f/u for health labs here.Exercises regularly. Body mass index 24.46.  Past medical history,surgical history, family history and social history were all reviewed and documented in the EPIC chart.  Gynecologic History Patient's last menstrual period was 04/07/2020.  Obstetric History OB History  Gravida Para Term Preterm AB Living  0 0 0 0 0 0  SAB TAB Ectopic Multiple Live Births  0 0 0 0 0     ROS: A ROS was performed and pertinent positives and negatives are included in the history.  GENERAL: No fevers or chills. HEENT: No change in vision, no earache, sore throat or sinus congestion. NECK: No pain or stiffness. CARDIOVASCULAR: No chest pain or pressure. No palpitations. PULMONARY: No shortness of breath, cough or wheeze. GASTROINTESTINAL: No abdominal pain, nausea, vomiting or diarrhea, melena or bright red blood per rectum. GENITOURINARY: No urinary frequency, urgency, hesitancy or dysuria. MUSCULOSKELETAL: No joint or muscle pain, no back pain, no recent trauma. DERMATOLOGIC: No rash, no itching, no lesions. ENDOCRINE: No polyuria, polydipsia, no heat or cold intolerance. No recent change in weight. HEMATOLOGICAL: No anemia or easy bruising or bleeding. NEUROLOGIC: No headache, seizures, numbness, tingling or weakness. PSYCHIATRIC: No depression, no loss of interest in normal activity or change in sleep pattern.     Exam:   BP 130/80 (BP Location: Right Arm, Patient Position: Sitting, Cuff Size: Normal)    Ht $R'5\' 5"'Ju$  (1.651 m)    Wt 147 lb (66.7 kg)     LMP 04/07/2020    BMI 24.46 kg/m   Body mass index is 24.46 kg/m.  General appearance : Well developed well nourished female. No acute distress HEENT: Eyes: no retinal hemorrhage or exudates,  Neck supple, trachea midline, no carotid bruits, no thyroidmegaly Lungs: Clear to auscultation, no rhonchi or wheezes, or rib retractions  Heart: Regular rate and rhythm, no murmurs or gallops Breast:Examined in sitting and supine position were symmetrical in appearance, no palpable masses or tenderness,  no skin retraction, no nipple inversion, no nipple discharge, no skin discoloration, no axillary or supraclavicular lymphadenopathy Abdomen: no palpable masses or tenderness, no rebound or guarding Extremities: no edema or skin discoloration or tenderness  Pelvic: Vulva: Normal             Vagina: No gross lesions or discharge  Cervix: No gross lesions or discharge  Uterus  AV, normal size, shape and consistency, non-tender and mobile  Adnexa  Without masses or tenderness  Anus: Normal   Assessment/Plan:  49 y.o. female for annual exam   1. Well female exam with routine gynecological exam Normal gynecologic exam.  Normal menses monthly.  Same sex partner.  No indication to repeat a Pap test this year. Breast exam normal. Screening mammogram March 2021 was negative. Fasting health labs here today. Very good body mass index at 24.46. Very fit with triathlons. Healthy nutrition. - CBC - Comp Met (CMET) - Lipid panel - TSH - VITAMIN D 25 Hydroxy (Vit-D Deficiency, Fractures)  2. Need for immunization against influenza Flu shot given. - Flu Vaccine QUAD  36+ mos IM  Princess Bruins MD, 9:33 AM 04/14/2020

## 2020-04-15 LAB — CBC
HCT: 39.2 % (ref 35.0–45.0)
Hemoglobin: 13.2 g/dL (ref 11.7–15.5)
MCH: 31.9 pg (ref 27.0–33.0)
MCHC: 33.7 g/dL (ref 32.0–36.0)
MCV: 94.7 fL (ref 80.0–100.0)
MPV: 10.4 fL (ref 7.5–12.5)
Platelets: 276 10*3/uL (ref 140–400)
RBC: 4.14 10*6/uL (ref 3.80–5.10)
RDW: 11.1 % (ref 11.0–15.0)
WBC: 6.5 10*3/uL (ref 3.8–10.8)

## 2020-04-15 LAB — LIPID PANEL
Cholesterol: 212 mg/dL — ABNORMAL HIGH (ref <200)
HDL: 75 mg/dL (ref >=50)
LDL Cholesterol (Calc): 122 mg/dL (calc) — ABNORMAL HIGH
Non-HDL Cholesterol (Calc): 137 mg/dL (calc) — ABNORMAL HIGH (ref <130)
Total CHOL/HDL Ratio: 2.8 (calc) (ref <5.0)
Triglycerides: 55 mg/dL (ref <150)

## 2020-04-15 LAB — COMPREHENSIVE METABOLIC PANEL
AG Ratio: 1.9 (calc) (ref 1.0–2.5)
ALT: 12 U/L (ref 6–29)
AST: 16 U/L (ref 10–35)
Albumin: 4.4 g/dL (ref 3.6–5.1)
Alkaline phosphatase (APISO): 42 U/L (ref 31–125)
BUN: 16 mg/dL (ref 7–25)
CO2: 28 mmol/L (ref 20–32)
Calcium: 9.3 mg/dL (ref 8.6–10.2)
Chloride: 101 mmol/L (ref 98–110)
Creat: 0.9 mg/dL (ref 0.50–1.10)
Globulin: 2.3 g/dL (calc) (ref 1.9–3.7)
Glucose, Bld: 100 mg/dL — ABNORMAL HIGH (ref 65–99)
Potassium: 4.4 mmol/L (ref 3.5–5.3)
Sodium: 137 mmol/L (ref 135–146)
Total Bilirubin: 0.7 mg/dL (ref 0.2–1.2)
Total Protein: 6.7 g/dL (ref 6.1–8.1)

## 2020-04-15 LAB — VITAMIN D 25 HYDROXY (VIT D DEFICIENCY, FRACTURES): Vit D, 25-Hydroxy: 36 ng/mL (ref 30–100)

## 2020-04-15 LAB — TSH: TSH: 2.33 mIU/L

## 2020-06-14 ENCOUNTER — Other Ambulatory Visit: Payer: Self-pay | Admitting: Family Medicine

## 2020-06-14 DIAGNOSIS — R001 Bradycardia, unspecified: Secondary | ICD-10-CM

## 2020-06-14 DIAGNOSIS — R5383 Other fatigue: Secondary | ICD-10-CM

## 2020-08-05 ENCOUNTER — Other Ambulatory Visit: Payer: Self-pay | Admitting: Family Medicine

## 2020-08-05 DIAGNOSIS — Z1231 Encounter for screening mammogram for malignant neoplasm of breast: Secondary | ICD-10-CM

## 2020-10-06 ENCOUNTER — Ambulatory Visit
Admission: RE | Admit: 2020-10-06 | Discharge: 2020-10-06 | Disposition: A | Discharge status: Home / Self Care | Payer: No Typology Code available for payment source | Source: Ambulatory Visit | Attending: Family Medicine | Admitting: Family Medicine

## 2020-10-06 ENCOUNTER — Other Ambulatory Visit: Payer: Self-pay

## 2020-10-06 DIAGNOSIS — Z1231 Encounter for screening mammogram for malignant neoplasm of breast: Secondary | ICD-10-CM

## 2020-10-07 ENCOUNTER — Other Ambulatory Visit: Payer: Self-pay | Admitting: Family Medicine

## 2020-10-07 DIAGNOSIS — R928 Other abnormal and inconclusive findings on diagnostic imaging of breast: Secondary | ICD-10-CM

## 2020-10-28 ENCOUNTER — Ambulatory Visit: Payer: No Typology Code available for payment source

## 2020-10-28 ENCOUNTER — Ambulatory Visit
Admission: RE | Admit: 2020-10-28 | Discharge: 2020-10-28 | Disposition: A | Discharge status: Home / Self Care | Payer: No Typology Code available for payment source | Source: Ambulatory Visit | Attending: Family Medicine | Admitting: Family Medicine

## 2020-10-28 ENCOUNTER — Other Ambulatory Visit: Payer: Self-pay

## 2020-10-28 DIAGNOSIS — R928 Other abnormal and inconclusive findings on diagnostic imaging of breast: Secondary | ICD-10-CM

## 2021-05-01 ENCOUNTER — Encounter: Payer: Self-pay | Admitting: Internal Medicine

## 2021-05-01 DIAGNOSIS — E559 Vitamin D deficiency, unspecified: Secondary | ICD-10-CM

## 2021-05-03 ENCOUNTER — Telehealth (HOSPITAL_COMMUNITY): Payer: Self-pay | Admitting: *Deleted

## 2021-05-03 ENCOUNTER — Ambulatory Visit (INDEPENDENT_AMBULATORY_CARE_PROVIDER_SITE_OTHER): Payer: No Typology Code available for payment source | Admitting: Internal Medicine

## 2021-05-03 ENCOUNTER — Encounter: Payer: Self-pay | Admitting: Internal Medicine

## 2021-05-03 ENCOUNTER — Encounter: Payer: Self-pay | Admitting: Radiology

## 2021-05-03 ENCOUNTER — Other Ambulatory Visit: Payer: Self-pay

## 2021-05-03 VITALS — BP 120/80 | HR 47 | Ht 67.0 in | Wt 150.6 lb

## 2021-05-03 DIAGNOSIS — R0602 Shortness of breath: Secondary | ICD-10-CM

## 2021-05-03 DIAGNOSIS — R000 Tachycardia, unspecified: Secondary | ICD-10-CM | POA: Diagnosis not present

## 2021-05-03 DIAGNOSIS — R001 Bradycardia, unspecified: Secondary | ICD-10-CM | POA: Diagnosis not present

## 2021-05-03 DIAGNOSIS — R002 Palpitations: Secondary | ICD-10-CM | POA: Diagnosis not present

## 2021-05-03 NOTE — Progress Notes (Signed)
Enrolled patient for a 14 day [employer] Event monitor to be mailed to patients home

## 2021-05-03 NOTE — Addendum Note (Signed)
Addended by: Rexanne Mano B on: 05/03/2021 04:40 PM   Modules accepted: Orders

## 2021-05-03 NOTE — Patient Instructions (Signed)
Medication Instructions:  No Changes In Medications at this time.  *If you need a refill on your cardiac medications before your next appointment, please call your pharmacy*  Testing/Procedures: Your physician has requested that you have an echocardiogram. Echocardiography is a painless test that uses sound waves to create images of your heart. It provides your doctor with information about the size and shape of your heart and how well your heart's chambers and valves are working. You may receive an ultrasound enhancing agent through an IV if needed to better visualize your heart during the echo.This procedure takes approximately one hour. There are no restrictions for this procedure. This will take place at the [address].   Your physician has requested that you have an exercise tolerance test. For further information please visit [email or web address]. Please also follow instruction sheet, as given. This will take place at [place], [address]. Do not drink or eat foods with caffeine for 24 hours before the test. (Chocolate, coffee, tea, or energy drinks) If you use an inhaler, bring it with you to the test. Do not smoke for 4 hours before the test. Wear comfortable shoes and clothing.  [employer] Cardiac Event Monitor Instructions Your physician has requested you wear your cardiac event monitor for 14 days.  [employer] may call or text to confirm a shipping address. The monitor will be sent to a land address via UPS. [employer] will not ship a monitor to a PO BOX. It typically takes 3-5 days to receive your monitor after it has been enrolled. [employer] will assist with USPS tracking if your package is delayed. The telephone number for [employer] is [phone]. Once you have received your monitor, please review the enclosed instructions. Instruction tutorials can also be viewed under help and settings on the enclosed cell phone. Your monitor has already been  registered assigning a specific monitor serial # to you.  Applying the monitor Remove cell phone from case and turn it on. The cell phone works as Dealer and needs to be within [employer] of you at all times. The cell phone will need to be charged on a daily basis. We recommend you plug the cell phone into the enclosed charger at your bedside table every night.  Monitor batteries: You will receive two monitor batteries labelled #1 and #2. These are your recorders. Plug battery #2 onto the second connection on the enclosed charger. Keep one battery on the charger at all times. This will keep the monitor battery deactivated. It will also keep it fully charged for when you need to switch your monitor batteries. A small light will be blinking on the battery emblem when it is charging. The light on the battery emblem will remain on when the battery is fully charged.  Open package of a Monitor strip. Insert battery #1 into black hood on strip and gently squeeze monitor battery onto connection as indicated in instruction booklet. Set aside while preparing skin.  Choose location for your strip, vertical or horizontal, as indicated in the instruction booklet. Shave to remove all hair from location. There cannot be any lotions, oils, powders, or colognes on skin where monitor is to be applied. Wipe skin clean with enclosed Saline wipe. Dry skin completely.  Peel paper labeled #1 off the back of the Monitor strip exposing the adhesive. Place the monitor on the chest in the vertical or horizontal position shown in the instruction booklet. One arrow on the monitor strip must be pointing  upward. Carefully remove paper labeled #2, attaching remainder of strip to your skin. Try not to create any folds or wrinkles in the strip as you apply it.  Firmly press and release the circle in the center of the monitor battery. You will hear a small beep. This is turning the monitor battery on. The heart  emblem on the monitor battery will light up every 5 seconds if the monitor battery in turned on and connected to the patient securely. Do not push and hold the circle down as this turns the monitor battery off. The cell phone will locate the monitor battery. A screen will appear on the cell phone checking the connection of your monitor strip. This may read poor connection initially but change to good connection within the next minute. Once your monitor accepts the connection you will hear a series of 3 beeps followed by a climbing crescendo of beeps. A screen will appear on the cell phone showing the two monitor strip placement options. Touch the picture that demonstrates where you applied the monitor strip.  Your monitor strip and battery are waterproof. You are able to shower, bathe, or swim with the monitor on. They just ask you do not submerge deeper than 3 feet underwater. We recommend removing the monitor if you are swimming in a lake, river, or ocean.  Your monitor battery will need to be switched to a fully charged monitor battery approximately once a week. The cell phone will alert you of an action which needs to be made.  On the cell phone, tap for details to reveal connection status, monitor battery status, and cell phone battery status. The green dots indicates your monitor is in good status. A red dot indicates there is something that needs your attention.  To record a symptom, click the circle on the monitor battery. In 30-60 seconds a list of symptoms will appear on the cell phone. Select your symptom and tap save. Your monitor will record a sustained or significant arrhythmia regardless of you clicking the button. Some patients do not feel the heart rhythm irregularities. [employer] will notify us of any serious or critical events.  Refer to instruction booklet for instructions on switching batteries, changing strips, the Do not disturb or Pause features, or any additional  questions.  Call [employer] at [phone], to confirm your monitor is transmitting and record your baseline. They will answer any questions you may have regarding the monitor instructions at that time.  Returning the monitor to [place] all equipment back into blue box. Peel off strip of paper to expose adhesive and close box securely. There is a prepaid UPS shipping label on this box. Drop in a UPS drop box, or at a UPS facility like [employer]. You may also contact [employer] to arrange UPS to pick up monitor package at your home.  Follow-Up: At [facility], you and your health needs are our priority.  As part of our continuing mission to provide you with exceptional heart care, we have created designated Provider Care Teams.  These Care Teams include your primary Cardiologist (physician) and Advanced Practice Providers (APPs -  Physician Assistants and Nurse Practitioners) who all work together to provide you with the care you need, when you need it.  We recommend signing up for the patient portal called "MyChart".  Sign up information is provided on this After Visit Summary.  MyChart is used to connect with patients for Virtual Visits (Telemedicine).  Patients are able to view lab/test results, encounter  notes, upcoming appointments, etc.  Non-urgent messages can be sent to your provider as well.   To learn more about what you can do with MyChart, go to [email or web address].    Your next appointment:   6 week(s)  The format for your next appointment:   In Person  Provider:   Janina Mayo, MD {  Other Instructions Palpitations Palpitations are feelings that your heartbeat is not normal. Your heartbeat may feel like it is: Uneven (irregular). Faster than normal. Fluttering. Skipping a beat. This is usually not a serious problem. However, a doctor will do tests and check your medical history to make sure that you do not have a serious heart problem. Follow these  instructions at home: Watch for any changes in your condition. Tell your doctor about any changes. Take these actions to help manage your symptoms: Eating and drinking Follow instructions from your doctor about things to eat and drink. You may be told to avoid these things: Drinks that have caffeine in them, such as coffee, tea, soft drinks, and energy drinks. Chocolate. Alcohol. Diet pills. Lifestyle   Try to lower your stress. These things can help you relax: Yoga. Deep breathing and meditation. Guided imagery. This is using words and images to create positive thoughts. Exercise, including swimming, jogging, and walking. Tell your doctor if you have more abnormal heartbeats when you are active. If you have chest pain or feel short of breath with exercise, do not keep doing the exercise until you are seen by your doctor. Biofeedback. This is using your mind to control things in your body, such as your heartbeat. Get plenty of rest and sleep. Keep a regular bed time. Do not use drugs, such as cocaine or ecstasy. Do not use marijuana. Do not smoke or use any products that contain nicotine or tobacco. If you need help quitting, ask your doctor. General instructions Take over-the-counter and prescription medicines only as told by your doctor. Keep all follow-up visits. You may need more tests if palpitations do not go away or get worse. Contact a doctor if: You keep having fast or uneven heartbeats for a long time. Your symptoms happen more often. Get help right away if: You have chest pain. You feel short of breath. You have a very bad headache. You feel dizzy. You faint. These symptoms may be an emergency. Get help right away. Call your local emergency services (911 in the [place] Do not wait to see if the symptoms will go away. Do not drive yourself to the hospital. Summary Palpitations are feelings that your heartbeat is uneven or faster than normal. It may feel like your heart is  fluttering or skipping a beat. Avoid food and drinks that may cause this condition. These include caffeine, chocolate, and alcohol. Try to lower your stress. Do not smoke or use drugs. Get help right away if you faint, feel dizzy, feel short of breath, have chest pain, or have a very bad headache. This information is not intended to replace advice given to you by your health care provider. Make sure you discuss any questions you have with your health care provider. Document Revised: 11/02/2020 Document Reviewed: 11/02/2020 Elsevier Patient Education  [place].

## 2021-05-03 NOTE — Progress Notes (Signed)
Cardiology Office Note:    Date:  05/03/2021   ID:  Lisa Walsh, DOB [birth year], MRN [id number]  PCP:  Kathyrn Lass, MD   [facility] Providers Cardiologist:  None     Referring MD: Cari Caraway, MD   No chief complaint on file. Fatigue/Bradycardia  History of Present Illness:    Lisa Walsh is a 50 y.o. female with a hx of  + anticardiolipin referral for SOB   She noted that she was in a triathlon and had some SOB. She was in the swimming portion on 03/18/2021 and went to take a breath and could not breath.She notes some shallow breaths. She felt like something was sitting on her chest. She went to the bike portion and she was not her normal self. She had to walking during the run portion. Her HR were 160-190. She had a lingering cough. She tried to go for a run to see how it felt.Marland Kitchen Her EKG noted sinus brady HR 38 bpm.   With exercise her pace has dropped from 9-10 minutes from 12 minutes. She's been tired and fatigued  She notes that her heart rates are dropping. It seems to be getting lower.  It takes time to get her heart rate up. Normal blood pressures typically. She feels in a fog. No syncope. No SCD. No smoking hx.  Daily minimal etoh. No drug use. Siblings are healthy. Father had a stroke and diabetic. Mother cholesterol is high and Factor V Leiden with DVT/PE hx. Her sister has it as well  No stress test or LHC in the past. No cardiac dx history.  Her PCP is Dr. [name] Heck   03/21/2021 LDL 122 HDL 97 TC 229 A1c 5.6% Crt 0.8 TSH 1.7    Past Medical History:  Diagnosis Date   Abnormal Pap smear of vagina    high risk of HPV   Anticardiolipin antibody positive    FHx: factor V Leiden mutation    with PEs, DVTs, hers was neg   H/O colposcopy with cervical biopsy    CIN 1 dated 9.14.06, normal pap since   Hemorrhoids    Low serum vitamin D    9/13   Pelvic pain in female     No past surgical history on file.  Current Medications: No outpatient  medications have been marked as taking for the 05/03/21 encounter (Office Visit) with Janina Mayo, MD.     Allergies:   Patient has no known allergies.   Social History   Socioeconomic History   Marital status: Single    Spouse name: Not on file   Number of children: Not on file   Years of education: Not on file   Highest education level: Not on file  Occupational History   Not on file  Tobacco Use   Smoking status: Never   Smokeless tobacco: Never  Vaping Use   Vaping Use: Never used  Substance and Sexual Activity   Alcohol use: Yes    Comment: 4-5 drinks a week    Drug use: Never   Sexual activity: Yes    Partners: Female    Comment: 1st intercourse- 56, partners- 20, married- 5 yrs   Other Topics Concern   Not on file  Social History Narrative   Not on file   Social Determinants of Health   Financial Resource Strain: Not on file  Food Insecurity: Not on file  Transportation Needs: Not on file  Physical Activity: Not on file  Stress:  Not on file  Social Connections: Not on file     Family History: The patient's family history includes Breast cancer in her maternal aunt; Diabetes in her father; Hyperlipidemia in her father; Hypertension in her father; Mental retardation in her father.  ROS:   Please see the history of present illness.     All other systems reviewed and are negative.  EKGs/Labs/Other Studies Reviewed:    The following studies were reviewed today:   EKG:  EKG is  ordered today.  The ekg ordered today demonstrates  Sinus bradycardia, PR 156 ms (nl), Qtc 392 ms. No delta wave.  Recent Labs: No results found for requested labs within last 8760 hours.  Recent Lipid Panel    Component Value Date/Time   CHOL 212 (H) 04/14/2020 0958   TRIG 55 04/14/2020 0958   HDL 75 04/14/2020 0958   CHOLHDL 2.8 04/14/2020 0958   LDLCALC 122 (H) 04/14/2020 0958     Risk Assessment/Calculations:           Physical Exam:    VS:   Vitals:    05/03/21 1113  BP: 120/80  Pulse: (!) 47  SpO2: 92%     Wt Readings from Last 3 Encounters:  04/14/20 147 lb (66.7 kg)  04/14/19 147 lb (66.7 kg)  08/20/17 147 lb (66.7 kg)     GEN:  Well nourished, well developed in no acute distress HEENT: Normal NECK: No JVD; No carotid bruits LYMPHATICS: No lymphadenopathy CARDIAC: RRR, no murmurs, rubs, gallops RESPIRATORY:  Clear to auscultation without rales, wheezing or rhonchi  ABDOMEN: Soft, non-tender, non-distended MUSCULOSKELETAL:  No edema; No deformity  SKIN: Warm and dry NEUROLOGIC:  Alert and oriented x 3 PSYCHIATRIC:  Normal affect   ASSESSMENT:    #Bradycardia: Her symptoms may be related to bradycardia/ AV conduction abn. 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 Her heart rates however, were noted as low as 38 bpm with her PCP. She has had acute decreased exercise capacity. Will plan to assess her AV conduction/rhythm with exercise to ensure she has normal rate response with exercise and no conduction abn. Will obtain TTE to ensure no valve dx and assess PASP.   PLAN:    In order of problems listed above:  Cardiac event 2 week GXT TTE Follow up 6 weeks      Medication Adjustments/Labs and Tests Ordered: Current medicines are reviewed at length with the patient today.  Concerns regarding medicines are outlined above.  Orders Placed This Encounter  Procedures   EKG 12-Lead    Signed, Janina Mayo, MD  05/03/2021 11:12 AM    [place]

## 2021-05-03 NOTE — Telephone Encounter (Signed)
Close encounter 

## 2021-05-04 ENCOUNTER — Ambulatory Visit (HOSPITAL_COMMUNITY)
Admission: RE | Admit: 2021-05-04 | Discharge: 2021-05-04 | Disposition: A | Discharge status: Home / Self Care | Payer: No Typology Code available for payment source | Source: Ambulatory Visit | Attending: Cardiovascular Disease | Admitting: Cardiovascular Disease

## 2021-05-04 DIAGNOSIS — R001 Bradycardia, unspecified: Secondary | ICD-10-CM | POA: Insufficient documentation

## 2021-05-04 DIAGNOSIS — R002 Palpitations: Secondary | ICD-10-CM | POA: Insufficient documentation

## 2021-05-04 DIAGNOSIS — R000 Tachycardia, unspecified: Secondary | ICD-10-CM | POA: Diagnosis not present

## 2021-05-04 LAB — EXERCISE TOLERANCE TEST
Angina Index: 0
Duke Treadmill Score: 14
Estimated workload: 16.2
Exercise duration (min): 13 min
Exercise duration (sec): 31 s
MPHR: 171 {beats}/min
Peak HR: 164 {beats}/min
Percent HR: 95 %
Rest HR: 57 {beats}/min
ST Depression (mm): 0 mm

## 2021-05-10 NOTE — Addendum Note (Signed)
Addended byPhineas Inches on: 05/10/2021 09:24 AM   Modules accepted: Orders

## 2021-05-20 ENCOUNTER — Ambulatory Visit (INDEPENDENT_AMBULATORY_CARE_PROVIDER_SITE_OTHER): Payer: No Typology Code available for payment source

## 2021-05-20 DIAGNOSIS — R002 Palpitations: Secondary | ICD-10-CM

## 2021-05-20 DIAGNOSIS — R001 Bradycardia, unspecified: Secondary | ICD-10-CM

## 2021-05-20 DIAGNOSIS — R000 Tachycardia, unspecified: Secondary | ICD-10-CM | POA: Diagnosis not present

## 2021-05-22 ENCOUNTER — Other Ambulatory Visit: Payer: Self-pay

## 2021-05-22 ENCOUNTER — Ambulatory Visit (HOSPITAL_COMMUNITY): Payer: No Typology Code available for payment source | Attending: Cardiology

## 2021-05-22 DIAGNOSIS — R000 Tachycardia, unspecified: Secondary | ICD-10-CM | POA: Insufficient documentation

## 2021-05-22 DIAGNOSIS — R001 Bradycardia, unspecified: Secondary | ICD-10-CM | POA: Insufficient documentation

## 2021-05-22 DIAGNOSIS — R002 Palpitations: Secondary | ICD-10-CM | POA: Insufficient documentation

## 2021-05-22 LAB — ECHOCARDIOGRAM COMPLETE
Area-P 1/2: 3.43 cm2
S' Lateral: 3.2 cm

## 2021-06-05 ENCOUNTER — Encounter: Payer: Self-pay | Admitting: Obstetrics & Gynecology

## 2021-06-05 ENCOUNTER — Other Ambulatory Visit (HOSPITAL_COMMUNITY)
Admission: RE | Admit: 2021-06-05 | Discharge: 2021-06-05 | Disposition: A | Discharge status: Home / Self Care | Payer: No Typology Code available for payment source | Source: Ambulatory Visit | Attending: Obstetrics & Gynecology | Admitting: Obstetrics & Gynecology

## 2021-06-05 ENCOUNTER — Other Ambulatory Visit: Payer: Self-pay

## 2021-06-05 ENCOUNTER — Ambulatory Visit (INDEPENDENT_AMBULATORY_CARE_PROVIDER_SITE_OTHER): Payer: No Typology Code available for payment source | Admitting: Obstetrics & Gynecology

## 2021-06-05 VITALS — BP 122/78 | HR 56 | Ht 65.25 in | Wt 156.0 lb

## 2021-06-05 DIAGNOSIS — N951 Menopausal and female climacteric states: Secondary | ICD-10-CM

## 2021-06-05 DIAGNOSIS — R001 Bradycardia, unspecified: Secondary | ICD-10-CM

## 2021-06-05 DIAGNOSIS — Z01419 Encounter for gynecological examination (general) (routine) without abnormal findings: Secondary | ICD-10-CM | POA: Diagnosis not present

## 2021-06-05 NOTE — Progress Notes (Signed)
Lisa Walsh [birth year] [id number]   History:    50 y.o. G0 same sex partner, Marlowe Kays.  Patient is a Clinical biochemist, doing very well.     RP: Established patient presenting for annual gyn exam    HPI:  No menses x 01/2021.  Fatigue, but no hot flashes and no night sweats.  Seen by Integrative MD, prescribed Prometrium 200 mg daily x 25 days, off of it x 5 days with no vaginal bleeding.  No pelvic pain. Pap Neg 03/2019.  Pap reflex today.  Breasts normal.  Screening mammo 09/2020, Lt Dx mammo Neg 10/2020.  Hypothyroidism with normal TSH on [facility] thyroid 32.5 mg daily.  Health labs done with Fam MD.  Exercises regularly.  Body mass index 25.76.  Recommend scheduling [facility].   Past medical history,surgical history, family history and social history were all reviewed and documented in the EPIC chart.  Gynecologic History No LMP recorded.  Obstetric History OB History  Gravida Para Term Preterm AB Living  0 0 0 0 0 0  SAB IAB Ectopic Multiple Live Births  0 0 0 0 0     ROS: A ROS was performed and pertinent positives and negatives are included in the history.  GENERAL: No fevers or chills. HEENT: No change in vision, no earache, sore throat or sinus congestion. NECK: No pain or stiffness. CARDIOVASCULAR: No chest pain or pressure. No palpitations. PULMONARY: No shortness of breath, cough or wheeze. GASTROINTESTINAL: No abdominal pain, nausea, vomiting or diarrhea, melena or bright red blood per rectum. GENITOURINARY: No urinary frequency, urgency, hesitancy or dysuria. MUSCULOSKELETAL: No joint or muscle pain, no back pain, no recent trauma. DERMATOLOGIC: No rash, no itching, no lesions. ENDOCRINE: No polyuria, polydipsia, no heat or cold intolerance. No recent change in weight. HEMATOLOGICAL: No anemia or easy bruising or bleeding. NEUROLOGIC: No headache, seizures, numbness, tingling or weakness. PSYCHIATRIC: No depression, no loss of interest in normal activity or change in sleep  pattern.     Exam:   BP 122/78   Pulse (!) 56   Ht 5' 5.25" (1.657 m)   Wt 156 lb (70.8 kg)   SpO2 91%   BMI 25.76 kg/m   Body mass index is 25.76 kg/m.  General appearance : Well developed well nourished female. No acute distress HEENT: Eyes: no retinal hemorrhage or exudates,  Neck supple, trachea midline, no carotid bruits, no thyroidmegaly Lungs: Clear to auscultation, no rhonchi or wheezes, or rib retractions  Heart: Regular rate and rhythm, no murmurs or gallops Breast:Examined in sitting and supine position were symmetrical in appearance, no palpable masses or tenderness,  no skin retraction, no nipple inversion, no nipple discharge, no skin discoloration, no axillary or supraclavicular lymphadenopathy Abdomen: no palpable masses or tenderness, no rebound or guarding Extremities: no edema or skin discoloration or tenderness  Pelvic: Vulva: Normal             Vagina: No gross lesions or discharge  Cervix: No gross lesions or discharge.  Pap reflex done.  Uterus  AV, normal size, shape and consistency, non-tender and mobile  Adnexa  Without masses or tenderness  Anus: Normal   Assessment/Plan:  50 y.o. female for annual exam   1. Encounter for routine gynecological examination with Papanicolaou smear of cervix No menses x 01/2021.  Fatigue, but no hot flashes and no night sweats.  Seen by Integrative MD, prescribed Prometrium 200 mg daily x 25 days, off of it x 5 days with no vaginal  bleeding.  No pelvic pain. Pap Neg 03/2019.  Pap reflex today.  Breasts normal.  Screening mammo 09/2020, Lt Dx mammo Neg 10/2020.  Hypothyroidism with normal TSH on [facility] thyroid 32.5 mg daily.  Health labs done with Fam MD.  Exercises regularly.  Body mass index 25.76.  Recommend scheduling [facility]. - Cytology - PAP( [facility])  2. Perimenopause No menses x 01/2021.  Fatigue, but no hot flashes and no night sweats.  Seen by Integrative MD, prescribed Prometrium 200 mg daily x 25 days, off of  it x 5 days with no vaginal bleeding.  No pelvic pain.  Probably entering menopause.  Counseling on HRT done, recommend completing her Cardio evaluation before considering HRT.  Observation at this time.  Vit D, Ca++ 1.2 g/d total and wt bearing physical activities.  3. Bradycardia Under investigation by Cardio.  Other orders - levofloxacin (LEVAQUIN) 750 MG tablet; Take 750 mg by mouth daily. - NP THYROID 30 MG tablet; Take 30 mg by mouth daily. - Vitamins A D K (ADK PO); Take by mouth. - Zinc 30 MG CAPS; Take by mouth. - Nutritional Supplements (HOMOCYSTEINE SUPPORT PO); Take by mouth. - progesterone (PROMETRIUM) 200 MG capsule; Take 200 mg by mouth daily. - TESTOSTERONE NA; Place into the nose. Pellets   Princess Bruins MD, 11:27 AM 06/05/2021

## 2021-06-06 LAB — CYTOLOGY - PAP
Adequacy: ABSENT
Diagnosis: NEGATIVE

## 2021-06-07 ENCOUNTER — Encounter: Payer: Self-pay | Admitting: Obstetrics & Gynecology

## 2021-06-07 NOTE — Telephone Encounter (Signed)
Pap smear result showed "Shift in flora suggestive of bacterial vaginosis "

## 2021-06-08 MED ORDER — TINIDAZOLE 500 MG PO TABS: ORAL_TABLET | ORAL | 0 refills | Status: DC

## 2021-06-08 NOTE — Telephone Encounter (Signed)
Princess Bruins, MD  You 7 minutes ago (9:31 AM)   Please treat with Tinidazole.

## 2021-06-27 ENCOUNTER — Encounter: Payer: Self-pay | Admitting: Internal Medicine

## 2021-06-27 ENCOUNTER — Other Ambulatory Visit: Payer: Self-pay

## 2021-06-27 ENCOUNTER — Ambulatory Visit (INDEPENDENT_AMBULATORY_CARE_PROVIDER_SITE_OTHER): Payer: No Typology Code available for payment source | Admitting: Internal Medicine

## 2021-06-27 VITALS — BP 110/90 | Ht 67.0 in | Wt 152.6 lb

## 2021-06-27 DIAGNOSIS — R9431 Abnormal electrocardiogram [ECG] [EKG]: Secondary | ICD-10-CM

## 2021-06-27 LAB — BASIC METABOLIC PANEL
BUN/Creatinine Ratio: 23 (ref 9–23)
BUN: 18 mg/dL (ref 6–24)
CO2: 25 mmol/L (ref 20–29)
Calcium: 9.3 mg/dL (ref 8.7–10.2)
Chloride: 101 mmol/L (ref 96–106)
Creatinine, Ser: 0.8 mg/dL (ref 0.57–1.00)
Glucose: 93 mg/dL (ref 70–99)
Potassium: 4.6 mmol/L (ref 3.5–5.2)
Sodium: 139 mmol/L (ref 134–144)
eGFR: 90 mL/min/{1.73_m2} (ref >59)

## 2021-06-27 NOTE — Patient Instructions (Signed)
Medication Instructions:  No Changes In Medications at this time. *If you need a refill on your cardiac medications before your next appointment, please call your pharmacy*  Lab Work: BMET- TODAY  If you have labs (blood work) drawn today and your tests are completely normal, you will receive your results only by: [place] (if you have MyChart) OR A paper copy in the mail If you have any lab test that is abnormal or we need to change your treatment, we will call you to review the results.  Testing/Procedures: Your physician has requested that you have cardiac CT. Cardiac computed tomography (CT) is a painless test that uses an x-ray machine to take clear, detailed pictures of your heart. For further information please visit [email or web address][facility][email or web address]. Please follow instruction sheet as given.  Follow-Up: At [facility], you and your health needs are our priority.  As part of our continuing mission to provide you with exceptional heart care, we have created designated Provider Care Teams.  These Care Teams include your primary Cardiologist (physician) and Advanced Practice Providers (APPs -  Physician Assistants and Nurse Practitioners) who all work together to provide you with the care you need, when you need it.  Your next appointment:   AS NEEDED   The format for your next appointment:   In Person  Provider:   Janina Mayo, MD    Other Instructions   Your cardiac CT will be scheduled at one of the below locations:   [facility] [address] [place], [place] [address] [phone]  If scheduled at [facility], please arrive at the [facility] main entrance (entrance A) of [facility] 30 minutes prior to test start time. You can use the FREE valet parking offered at the main entrance (encouraged to control the heart rate for the test) Proceed to the [facility] Radiology Department (first floor) to check-in and test prep.  Please follow  these instructions carefully (unless otherwise directed):  On the Night Before the Test: Be sure to Drink plenty of water. Do not consume any caffeinated/decaffeinated beverages or chocolate 12 hours prior to your test. Do not take any antihistamines 12 hours prior to your test.  On the Day of the Test: Drink plenty of water until 1 hour prior to the test. Do not eat any food 4 hours prior to the test. You may take your regular medications prior to the test.  HOLD Furosemide/Hydrochlorothiazide morning of the test. FEMALES- please wear underwire-free bra if available, avoid dresses & tight clothing  After the Test: Drink plenty of water. After receiving IV contrast, you may experience a mild flushed feeling. This is normal. On occasion, you may experience a mild rash up to 24 hours after the test. This is not dangerous. If this occurs, you can take Benadryl 25 mg and increase your fluid intake. If you experience trouble breathing, this can be serious. If it is severe call 911 IMMEDIATELY. If it is mild, please call our office. If you take any of these medications: Glipizide/Metformin, Avandament, Glucavance, please do not take 48 hours after completing test unless otherwise instructed.  Please allow 2-4 weeks for scheduling of routine cardiac CTs. Some insurance companies require a pre-authorization which may delay scheduling of this test.   For non-scheduling related questions, please contact the cardiac imaging nurse navigator should you have any questions/concerns: Marchia Bond, Cardiac Imaging Nurse Navigator Gordy Clement, Cardiac Imaging Nurse Navigator [facility] Direct Office  Dial: [phone]   For scheduling needs, including cancellations and rescheduling, please call [place], [phone].

## 2021-06-27 NOTE — Progress Notes (Signed)
Cardiology Office Note:    Date:  06/27/2021   ID:  Lisa Walsh, DOB 08-14-1970, MRN [id number]  PCP:  Kathyrn Lass, MD   [place] Providers Cardiologist:  Janina Mayo, MD     Referring MD: Kathyrn Lass, MD   No chief complaint on file. Fatigue/Bradycardia  History of Present Illness:    Lisa Walsh is a 51 y.o. female with a hx of  + anticardiolipin referral for SOB  She noted that she was in a triathlon and had some SOB. She was in the swimming portion on 03/18/2021 and went to take a breath and could not breath.She notes some shallow breaths. She felt like something was sitting on her chest. She went to the bike portion and she was not her normal self. She had to walking during the run portion. Her HR were 160-190. She had a lingering cough. She tried to go for a run to see how it felt.Marland Kitchen Her EKG noted sinus brady HR 38 bpm.   With exercise her pace has dropped from 9-10 minutes from 12 minutes. She's been tired and fatigued  She notes that her heart rates are dropping. It seems to be getting lower.  It takes time to get her heart rate up. Normal blood pressures typically. She feels in a fog. No syncope. No SCD. No smoking hx.  Daily minimal etoh. No drug use. Siblings are healthy. Father had a stroke and diabetic. Mother cholesterol is high and Factor V Leiden with DVT/PE hx. Her sister has it as well  No stress test or LHC in the past. No cardiac dx history.  Her PCP is Dr. [name] Heck   03/21/2021 LDL 122 HDL 97 TC 229 A1c 5.6% Crt 0.8 TSH 1.7  Interim Hx: Her heart rates range around 60. She gets tightness sometimes. Her blood pressures dropped with ETT and she was SOB. Her blood pressure dropped to the 90s. She is taking progesterone, but is holding off until she completes her work up. Based on her PCP testing she may be going through menopause.  She continues to feel easily fatigued.  TC 2022 TC 260 LDL 159 HDL 81 TG 114  Cardiology Studies: TTE- normal  LV function 05/22/2021 [place] 11/26-12/12- no significant arrythmias   Past Medical History:  Diagnosis Date   Abnormal Pap smear of vagina    high risk of HPV   Anticardiolipin antibody positive    Bradycardia    FHx: factor V Leiden mutation    with PEs, DVTs, hers was neg   H/O colposcopy with cervical biopsy    CIN 1 dated 9.14.06, normal pap since   Hemorrhoids    Low serum vitamin D    9/13   Pelvic pain in female     No past surgical history on file.  Current Medications: No outpatient medications have been marked as taking for the 06/27/21 encounter (Appointment) with Janina Mayo, MD.     Allergies:   Patient has no known allergies.   Social History   Socioeconomic History   Marital status: Married    Spouse name: Not on file   Number of children: Not on file   Years of education: Not on file   Highest education level: Not on file  Occupational History   Not on file  Tobacco Use   Smoking status: Never   Smokeless tobacco: Never  Vaping Use   Vaping Use: Never used  Substance and Sexual Activity   Alcohol use:  Yes    Comment: 4-5 drinks a week    Drug use: Never   Sexual activity: Yes    Partners: Female    Comment: 1st intercourse- 68, partners- 42, married- 5 yrs   Other Topics Concern   Not on file  Social History Narrative   Not on file   Social Determinants of Health   Financial Resource Strain: Not on file  Food Insecurity: Not on file  Transportation Needs: Not on file  Physical Activity: Not on file  Stress: Not on file  Social Connections: Not on file     Family History: The patient's family history includes Breast cancer in her maternal aunt; Diabetes in her father; Hyperlipidemia in her father; Hypertension in her father; Mental retardation in her father.  ROS:   Please see the history of present illness.     All other systems reviewed and are negative.  EKGs/Labs/Other Studies Reviewed:    The following studies were  reviewed today:   EKG:  EKG is  ordered today.  The ekg ordered today demonstrates   Initial visit  Sinus bradycardia, PR 156 ms (nl), Qtc 392 ms. No delta wave.  No new EKG  Recent Labs: No results found for requested labs within last 8760 hours.  Recent Lipid Panel    Component Value Date/Time   CHOL 212 (H) 04/14/2020 0958   TRIG 55 04/14/2020 0958   HDL 75 04/14/2020 0958   CHOLHDL 2.8 04/14/2020 0958   LDLCALC 122 (H) 04/14/2020 0958     Risk Assessment/Calculations:    ASCVD 0.7%       Physical Exam:    VS:   There were no vitals filed for this visit.    Wt Readings from Last 3 Encounters:  06/05/21 156 lb (70.8 kg)  05/03/21 150 lb 9.6 oz (68.3 kg)  04/14/20 147 lb (66.7 kg)     Physical Exam Gen: well appearing Neuro: alert and oriented,no focal deficits CV: r,r,r no murmurs. No JVD Vasc: 2+ radial pulses Pulm: CLAB Abd: non distended Ext: No LE edema Skin: warm and well perfused Psych: normal mood   ASSESSMENT:    #Abnormal ETT Stress : She initially presented to clinic with bradycardia and fatigue. Her echo was normal. She had ETT and appropriate heart rate response, however her blood pressure did reduce and she felt symptomatic. She is very low risk. Does have elevate LDL, HDL is high too. However, with abnormal stress will proceed with coronary CTA,  PLAN:    In order of problems listed above:  Coronary CTA Follow up PRN      Medication Adjustments/Labs and Tests Ordered: Current medicines are reviewed at length with the patient today.  Concerns regarding medicines are outlined above.  No orders of the defined types were placed in this encounter.   Signed, Janina Mayo, MD  06/27/2021 7:43 AM    [facility]

## 2021-07-05 ENCOUNTER — Telehealth (HOSPITAL_COMMUNITY): Payer: Self-pay | Admitting: Emergency Medicine

## 2021-07-05 NOTE — Telephone Encounter (Signed)
Attempted to call patient regarding upcoming cardiac CT appointment. °Left message on voicemail with name and callback number °Natane Heward RN Navigator Cardiac Imaging °[facility] °[phone] Office °[phone] Cell ° °

## 2021-07-06 ENCOUNTER — Encounter (HOSPITAL_COMMUNITY): Payer: Self-pay

## 2021-07-06 ENCOUNTER — Ambulatory Visit (HOSPITAL_COMMUNITY)
Admission: RE | Admit: 2021-07-06 | Discharge: 2021-07-06 | Disposition: A | Discharge status: Home / Self Care | Payer: No Typology Code available for payment source | Source: Ambulatory Visit | Attending: Internal Medicine | Admitting: Internal Medicine

## 2021-07-06 ENCOUNTER — Other Ambulatory Visit: Payer: Self-pay

## 2021-07-06 DIAGNOSIS — R9431 Abnormal electrocardiogram [ECG] [EKG]: Secondary | ICD-10-CM | POA: Insufficient documentation

## 2021-07-06 MED ORDER — NITROGLYCERIN 0.4 MG SL SUBL
SUBLINGUAL_TABLET | SUBLINGUAL | Status: AC
  Filled 2021-07-06: qty 2

## 2021-07-06 MED ORDER — IOHEXOL 350 MG/ML SOLN
95.0000 mL | Freq: Once | INTRAVENOUS | Status: AC | PRN
  Administered 2021-07-06: 95 mL via INTRAVENOUS

## 2021-07-06 MED ORDER — METOPROLOL TARTRATE 5 MG/5ML IV SOLN
INTRAVENOUS | Status: AC
  Administered 2021-07-06: 5 mg via INTRAVENOUS
  Filled 2021-07-06: qty 5

## 2021-07-06 MED ORDER — NITROGLYCERIN 0.4 MG SL SUBL
0.8000 mg | SUBLINGUAL_TABLET | Freq: Once | SUBLINGUAL | Status: AC
  Administered 2021-07-06: 0.8 mg via SUBLINGUAL

## 2021-07-06 MED ORDER — METOPROLOL TARTRATE 5 MG/5ML IV SOLN: 5.0000 mg | INTRAVENOUS | Status: DC | PRN

## 2021-08-08 ENCOUNTER — Telehealth: Payer: Self-pay | Admitting: *Deleted

## 2021-08-08 MED ORDER — ALCLOMETASONE DIPROPIONATE 0.05 % EX CREA: TOPICAL_CREAM | Freq: Two times a day (BID) | CUTANEOUS | 3 refills | Status: DC | PRN

## 2021-08-08 NOTE — Telephone Encounter (Signed)
Phone call from patient needing refill for alclometasone.

## 2021-11-01 ENCOUNTER — Other Ambulatory Visit: Payer: Self-pay | Admitting: Family Medicine

## 2021-11-01 DIAGNOSIS — Z1231 Encounter for screening mammogram for malignant neoplasm of breast: Secondary | ICD-10-CM

## 2021-11-02 ENCOUNTER — Ambulatory Visit
Admission: RE | Admit: 2021-11-02 | Discharge: 2021-11-02 | Disposition: A | Discharge status: Home / Self Care | Payer: No Typology Code available for payment source | Source: Ambulatory Visit | Attending: Family Medicine | Admitting: Family Medicine

## 2021-11-02 DIAGNOSIS — Z1231 Encounter for screening mammogram for malignant neoplasm of breast: Secondary | ICD-10-CM

## 2022-06-06 ENCOUNTER — Encounter: Payer: Self-pay | Admitting: Obstetrics & Gynecology

## 2022-06-06 ENCOUNTER — Ambulatory Visit (INDEPENDENT_AMBULATORY_CARE_PROVIDER_SITE_OTHER): Payer: BC Managed Care – PPO | Admitting: Obstetrics & Gynecology

## 2022-06-06 VITALS — BP 122/80 | HR 80 | Ht 62.5 in | Wt 147.0 lb

## 2022-06-06 DIAGNOSIS — Z01419 Encounter for gynecological examination (general) (routine) without abnormal findings: Secondary | ICD-10-CM

## 2022-06-06 DIAGNOSIS — Z780 Asymptomatic menopausal state: Secondary | ICD-10-CM | POA: Diagnosis not present

## 2022-06-06 NOTE — Progress Notes (Signed)
Eshaal Duby 1970-07-09 [id number]   History:    51 y.o. G0 same sex partner, Marlowe Kays.  Patient is a Clinical biochemist, doing very well.     RP: Established patient presenting for annual gyn exam    HPI:  Postmenopause, well on no HRT.  No PMB.  No pelvic pain.  Fatigue, but no hot flashes and no night sweats.  Seen by Integrative MD, on Testo pellet, but decided to stop.  Pap Neg 05/2021 Neg.  No indication to repeat Pap today.  Breasts normal.  Screening mammo Neg 10/2021. Hypothyroidism with normal TSH on Armour thyroid. Health labs done with Fam MD.  Exercises regularly.  Body mass index 26.46.  Recommend scheduling [facility].   Past medical history,surgical history, family history and social history were all reviewed and documented in the EPIC chart.  Gynecologic History Patient's last menstrual period was 05/28/2022.  Obstetric History OB History  Gravida Para Term Preterm AB Living  0 0 0 0 0 0  SAB IAB Ectopic Multiple Live Births  0 0 0 0 0     ROS: A ROS was performed and pertinent positives and negatives are included in the history. GENERAL: No fevers or chills. HEENT: No change in vision, no earache, sore throat or sinus congestion. NECK: No pain or stiffness. CARDIOVASCULAR: No chest pain or pressure. No palpitations. PULMONARY: No shortness of breath, cough or wheeze. GASTROINTESTINAL: No abdominal pain, nausea, vomiting or diarrhea, melena or bright red blood per rectum. GENITOURINARY: No urinary frequency, urgency, hesitancy or dysuria. MUSCULOSKELETAL: No joint or muscle pain, no back pain, no recent trauma. DERMATOLOGIC: No rash, no itching, no lesions. ENDOCRINE: No polyuria, polydipsia, no heat or cold intolerance. No recent change in weight. HEMATOLOGICAL: No anemia or easy bruising or bleeding. NEUROLOGIC: No headache, seizures, numbness, tingling or weakness. PSYCHIATRIC: No depression, no loss of interest in normal activity or change in sleep pattern.      Exam:   BP 122/80 (BP Location: Right Arm, Patient Position: Sitting, Cuff Size: Normal)   Pulse 80   Ht 5' 2.5" (1.588 m)   Wt 147 lb (66.7 kg)   LMP 05/28/2022 Comment: female partner no contraception  BMI 26.46 kg/m   Body mass index is 26.46 kg/m.  General appearance : Well developed well nourished female. No acute distress HEENT: Eyes: no retinal hemorrhage or exudates,  Neck supple, trachea midline, no carotid bruits, no thyroidmegaly Lungs: Clear to auscultation, no rhonchi or wheezes, or rib retractions  Heart: Regular rate and rhythm, no murmurs or gallops Breast:Examined in sitting and supine position were symmetrical in appearance, no palpable masses or tenderness,  no skin retraction, no nipple inversion, no nipple discharge, no skin discoloration, no axillary or supraclavicular lymphadenopathy Abdomen: no palpable masses or tenderness, no rebound or guarding Extremities: no edema or skin discoloration or tenderness  Pelvic: Vulva: Normal             Vagina: No gross lesions or discharge  Cervix: No gross lesions or discharge  Uterus  RV, normal size, shape and consistency, non-tender and mobile  Adnexa  Without masses or tenderness  Anus: Normal   Assessment/Plan:  51 y.o. female for annual exam   1. Well female exam with routine gynecological exam Postmenopause, well on no HRT.  No PMB.  No pelvic pain.  Fatigue, but no hot flashes and no night sweats.  Seen by Integrative MD, on Testo pellet, but decided to stop.  Pap Neg 05/2021 Neg.  No indication to repeat Pap today.  Breasts normal.  Screening mammo Neg 10/2021. Hypothyroidism with normal TSH on Armour thyroid. Health labs done with Fam MD.  Exercises regularly.  Body mass index 26.46.  Recommend scheduling Colono.   2. Postmenopause Postmenopause, well on no HRT.  No PMB.  No pelvic pain.  Fatigue, but no hot flashes and no night sweats.  Seen by Integrative MD, on Testo pellet, but decided to stop.    Other orders - ADDERALL XR 30 MG 24 hr capsule   Princess Bruins MD, 3:15 PM

## 2022-08-28 DIAGNOSIS — Z961 Presence of intraocular lens: Secondary | ICD-10-CM | POA: Diagnosis not present

## 2022-08-28 DIAGNOSIS — H524 Presbyopia: Secondary | ICD-10-CM | POA: Diagnosis not present

## 2022-10-02 DIAGNOSIS — L570 Actinic keratosis: Secondary | ICD-10-CM | POA: Diagnosis not present

## 2022-10-02 DIAGNOSIS — D0461 Carcinoma in situ of skin of right upper limb, including shoulder: Secondary | ICD-10-CM | POA: Diagnosis not present

## 2022-10-02 DIAGNOSIS — D1801 Hemangioma of skin and subcutaneous tissue: Secondary | ICD-10-CM | POA: Diagnosis not present

## 2022-10-02 DIAGNOSIS — L578 Other skin changes due to chronic exposure to nonionizing radiation: Secondary | ICD-10-CM | POA: Diagnosis not present

## 2022-10-02 DIAGNOSIS — L821 Other seborrheic keratosis: Secondary | ICD-10-CM | POA: Diagnosis not present

## 2022-10-02 DIAGNOSIS — D485 Neoplasm of uncertain behavior of skin: Secondary | ICD-10-CM | POA: Diagnosis not present

## 2022-10-02 DIAGNOSIS — L814 Other melanin hyperpigmentation: Secondary | ICD-10-CM | POA: Diagnosis not present

## 2022-10-03 ENCOUNTER — Other Ambulatory Visit: Payer: Self-pay | Admitting: Family Medicine

## 2022-10-03 DIAGNOSIS — Z1231 Encounter for screening mammogram for malignant neoplasm of breast: Secondary | ICD-10-CM

## 2022-11-01 DIAGNOSIS — C44622 Squamous cell carcinoma of skin of right upper limb, including shoulder: Secondary | ICD-10-CM | POA: Diagnosis not present

## 2022-11-09 ENCOUNTER — Ambulatory Visit
Admission: RE | Admit: 2022-11-09 | Discharge: 2022-11-09 | Disposition: A | Discharge status: Home / Self Care | Payer: BC Managed Care – PPO | Source: Ambulatory Visit | Attending: Family Medicine | Admitting: Family Medicine

## 2022-11-09 DIAGNOSIS — Z1231 Encounter for screening mammogram for malignant neoplasm of breast: Secondary | ICD-10-CM | POA: Diagnosis not present

## 2023-05-20 DIAGNOSIS — L570 Actinic keratosis: Secondary | ICD-10-CM | POA: Diagnosis not present

## 2023-06-10 ENCOUNTER — Ambulatory Visit: Payer: BC Managed Care – PPO | Admitting: Obstetrics and Gynecology

## 2023-06-14 ENCOUNTER — Ambulatory Visit (INDEPENDENT_AMBULATORY_CARE_PROVIDER_SITE_OTHER): Payer: BC Managed Care – PPO | Admitting: Obstetrics and Gynecology

## 2023-06-14 ENCOUNTER — Encounter: Payer: Self-pay | Admitting: Obstetrics and Gynecology

## 2023-06-14 ENCOUNTER — Other Ambulatory Visit (HOSPITAL_COMMUNITY)
Admission: RE | Admit: 2023-06-14 | Discharge: 2023-06-14 | Disposition: A | Discharge status: Home / Self Care | Payer: BC Managed Care – PPO | Source: Ambulatory Visit | Attending: Obstetrics and Gynecology | Admitting: Obstetrics and Gynecology

## 2023-06-14 VITALS — BP 124/76 | HR 64 | Ht 65.0 in | Wt 153.0 lb

## 2023-06-14 DIAGNOSIS — Z01419 Encounter for gynecological examination (general) (routine) without abnormal findings: Secondary | ICD-10-CM | POA: Insufficient documentation

## 2023-06-14 MED ORDER — INTRAROSA 6.5 MG VA INST: 1.0000 | VAGINAL_INSERT | Freq: Every evening | VAGINAL | 12 refills | Status: DC | PRN

## 2023-06-14 NOTE — Progress Notes (Signed)
52 y.o. y.o. female here for annual exam. No LMP recorded. Patient is perimenopausal.    G0 same sex partner, Junious Dresser.  Patient is a Product/process development scientist, doing very well.     RP: Established patient presenting for annual gyn exam    HPI:  Postmenopause, well on no HRT.  No PMB.  No pelvic pain.  Fatigue, but no hot flashes and no night sweats.  Seen by Integrative MD, on Testo pellet, but decided to stop.  Pap Neg 05/2021 Neg.  No indication to repeat Pap today.  Breasts normal.  Screening mammo Neg 10/2022. Hypothyroidism with normal TSH on Armour thyroid. Health labs done with Fam MD.  Exercises regularly.  Body mass index 26.46.  Recommend scheduling Colono. Body mass index is 25.46 kg/m.     05/14/2016    8:06 AM  Depression screen PHQ 2/9  Decreased Interest 0  Down, Depressed, Hopeless 0  PHQ - 2 Score 0    Blood pressure 124/76, pulse 64, height 5\' 5"  (1.651 m), weight 153 lb (69.4 kg), SpO2 99%.     Component Value Date/Time   DIAGPAP  06/05/2021 1147    - Negative for intraepithelial lesion or malignancy (NILM)   ADEQPAP  06/05/2021 1147    Satisfactory for evaluation; transformation zone component ABSENT.    GYN HISTORY:    Component Value Date/Time   DIAGPAP  06/05/2021 1147    - Negative for intraepithelial lesion or malignancy (NILM)   ADEQPAP  06/05/2021 1147    Satisfactory for evaluation; transformation zone component ABSENT.    OB History  Gravida Para Term Preterm AB Living  0 0 0 0 0 0  SAB IAB Ectopic Multiple Live Births  0 0 0 0 0    Past Medical History:  Diagnosis Date   Abnormal Pap smear of vagina    high risk of HPV   Anticardiolipin antibody positive    Bradycardia    FHx: factor V Leiden mutation    with PEs, DVTs, hers was neg   H/O colposcopy with cervical biopsy    CIN 1 dated 9.14.06, normal pap since   Hemorrhoids    Low serum vitamin D    9/13   Pelvic pain in female     Past Surgical History:  Procedure Laterality  Date   SHOULDER SURGERY      Current Outpatient Medications on File Prior to Visit  Medication Sig Dispense Refill   ADDERALL XR 30 MG 24 hr capsule 40 mg.     fluorouracil (EFUDEX) 5 % cream Apply topically 2 (two) times daily.     TESTOSTERONE NA Place into the nose. Pellets     No current facility-administered medications on file prior to visit.    Social History   Socioeconomic History   Marital status: Married    Spouse name: Not on file   Number of children: Not on file   Years of education: Not on file   Highest education level: Not on file  Occupational History   Not on file  Tobacco Use   Smoking status: Never   Smokeless tobacco: Never  Vaping Use   Vaping status: Never Used  Substance and Sexual Activity   Alcohol use: Yes    Comment: 4-5 drinks a week    Drug use: Never   Sexual activity: Yes    Partners: Female    Comment: 1st intercourse- 35, partners- 6, married- 5 yrs   Other Topics Concern  Not on file  Social History Narrative   Not on file   Social Drivers of Health   Financial Resource Strain: Not on file  Food Insecurity: Not on file  Transportation Needs: Not on file  Physical Activity: Not on file  Stress: Not on file  Social Connections: Not on file  Intimate Partner Violence: Not on file    Family History  Problem Relation Age of Onset   Diabetes Father    Hyperlipidemia Father    Mental retardation Father    Hypertension Father    Breast cancer Maternal Aunt        40's     No Known Allergies    Patient's last menstrual period was No LMP recorded. Patient is perimenopausal..            Review of Systems Alls systems reviewed and are negative.     Physical Exam Constitutional:      Appearance: Normal appearance.  Genitourinary:     Vulva and urethral meatus normal.     No lesions in the vagina.     Right Labia: No rash, lesions or skin changes.    Left Labia: No lesions, skin changes or rash.    No vaginal  discharge or tenderness.     No vaginal prolapse present.    No vaginal atrophy present.     Right Adnexa: not tender, not palpable and no mass present.    Left Adnexa: not tender, not palpable and no mass present.    No cervical motion tenderness or discharge.     Uterus is not enlarged, tender or irregular.  Breasts:    Right: Normal.     Left: Normal.  HENT:     Head: Normocephalic.  Neck:     Thyroid: No thyroid mass, thyromegaly or thyroid tenderness.  Cardiovascular:     Rate and Rhythm: Normal rate and regular rhythm.     Heart sounds: Normal heart sounds, S1 normal and S2 normal.  Pulmonary:     Effort: Pulmonary effort is normal.     Breath sounds: Normal breath sounds and air entry.  Abdominal:     General: There is no distension.     Palpations: Abdomen is soft. There is no mass.     Tenderness: There is no abdominal tenderness. There is no guarding or rebound.  Musculoskeletal:        General: Normal range of motion.     Cervical back: Full passive range of motion without pain, normal range of motion and neck supple. No tenderness.     Right lower leg: No edema.     Left lower leg: No edema.  Neurological:     Mental Status: She is alert.  Skin:    General: Skin is warm.  Psychiatric:        Mood and Affect: Mood normal.        Behavior: Behavior normal.        Thought Content: Thought content normal.  Vitals and nursing note reviewed. Exam conducted with a chaperone present.       A:         Well Woman GYN exam                             P:        Pap smear collected today Encouraged annual mammogram screening Colon cancer screening referral placed today DXA not indicated Labs and immunizations  ordered today Encouraged healthy lifestyle practices Encouraged Vit D and Calcium   No follow-ups on file.  Earley Favor

## 2023-06-17 ENCOUNTER — Other Ambulatory Visit: Payer: BC Managed Care – PPO

## 2023-06-18 LAB — CYTOLOGY - PAP
Comment: NEGATIVE
Diagnosis: NEGATIVE
High risk HPV: NEGATIVE

## 2023-07-02 IMAGING — MG MM DIGITAL SCREENING BILAT W/ TOMO AND CAD
8 series · 9 of 24 positions shown · non-contrast
Comparison: Previous exam(s).

CLINICAL DATA: Screening.

EXAM:
DIGITAL SCREENING BILATERAL MAMMOGRAM WITH TOMOSYNTHESIS AND CAD
TECHNIQUE: Bilateral screening digital craniocaudal and mediolateral oblique
mammograms were obtained. Bilateral screening digital breast
tomosynthesis was performed. The images were evaluated with
computer-aided detection.

[L MLO synth-2D]
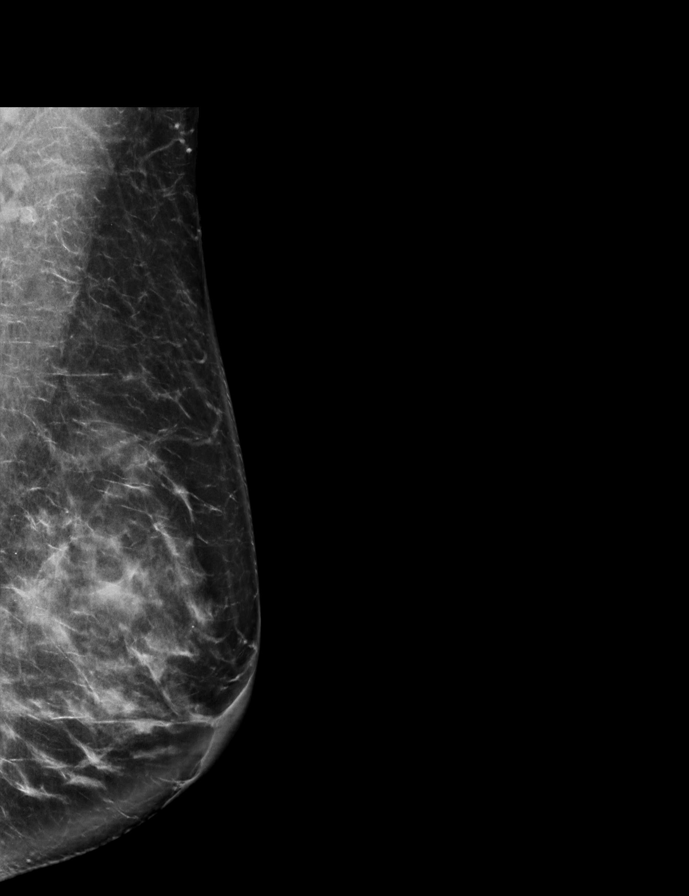

[R MLO synth-2D]
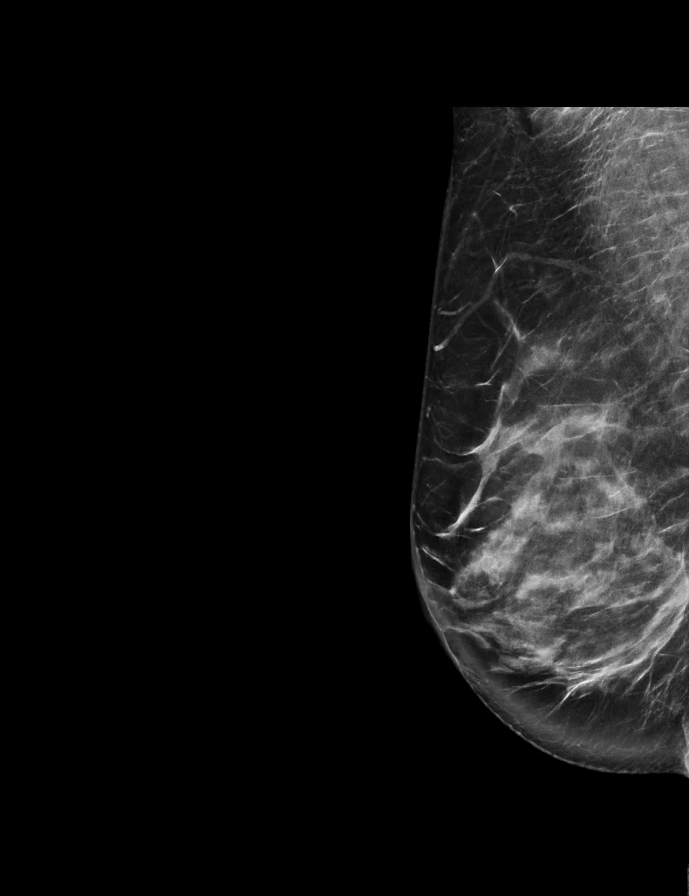

[R CC synth-2D]
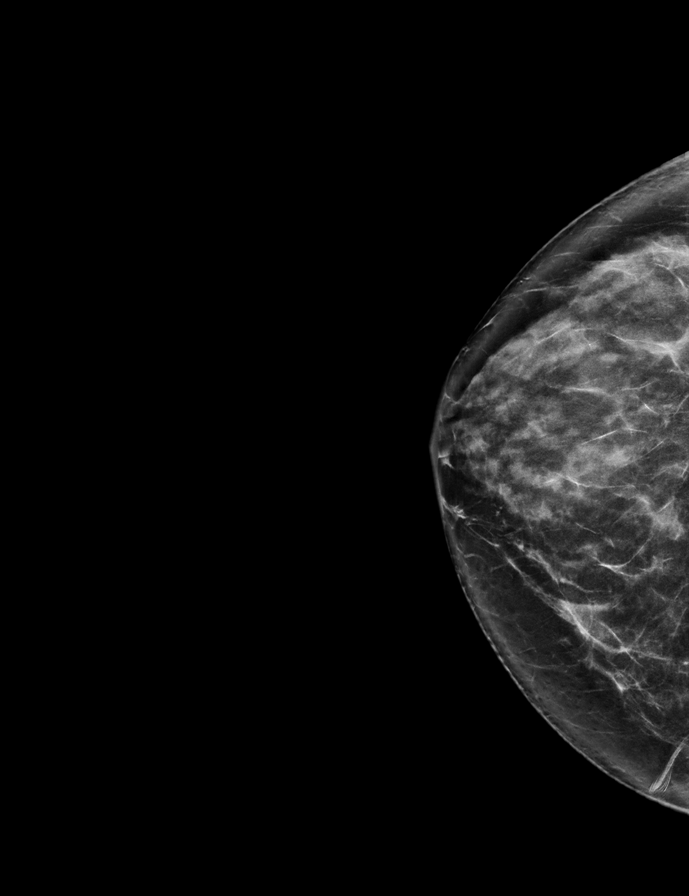

[L CC synth-2D]
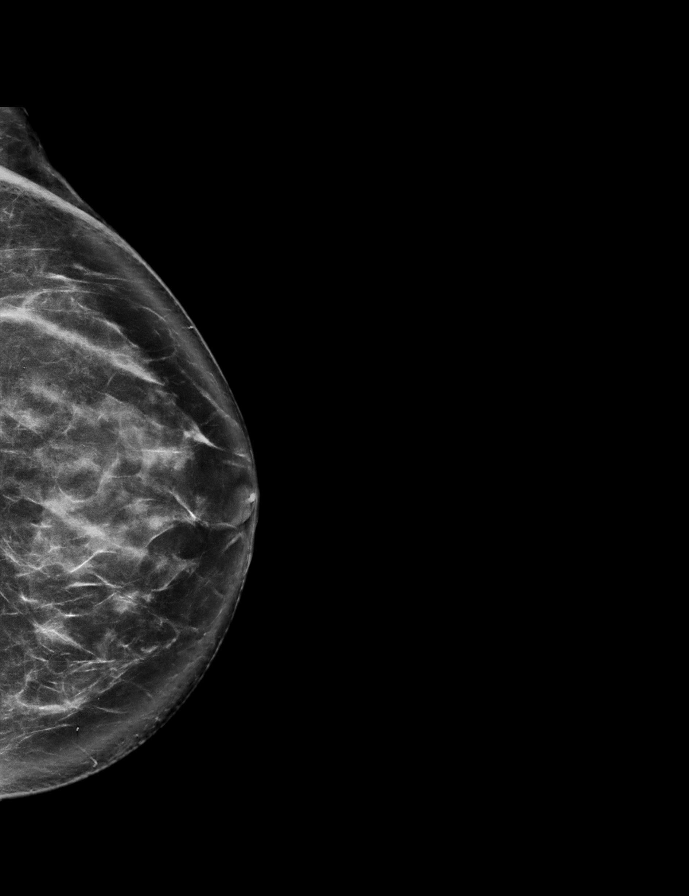

[L MLO tomo · 2 of 84 frames shown]
[frame 28/84]
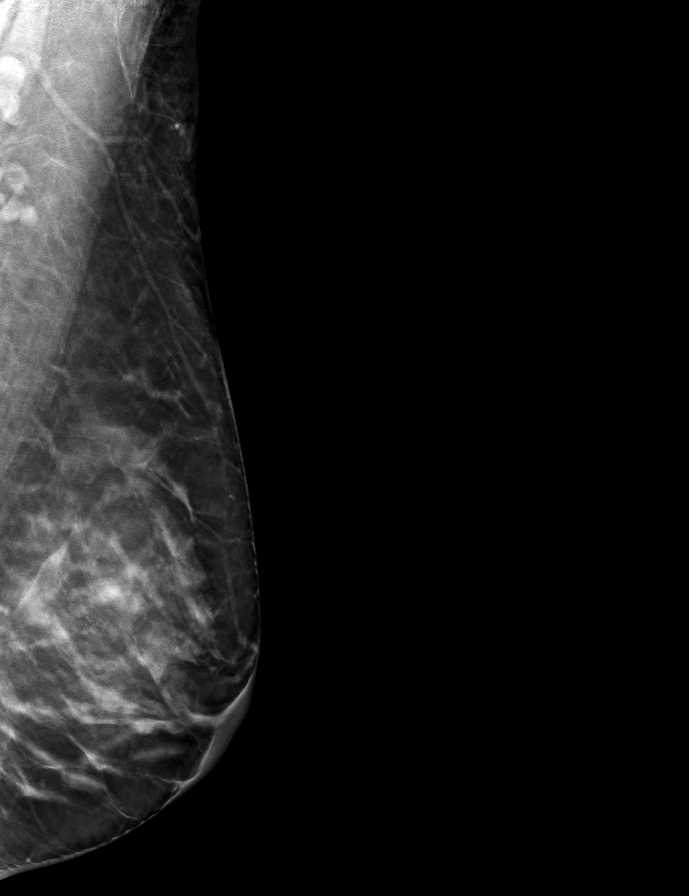
[frame 43/84]
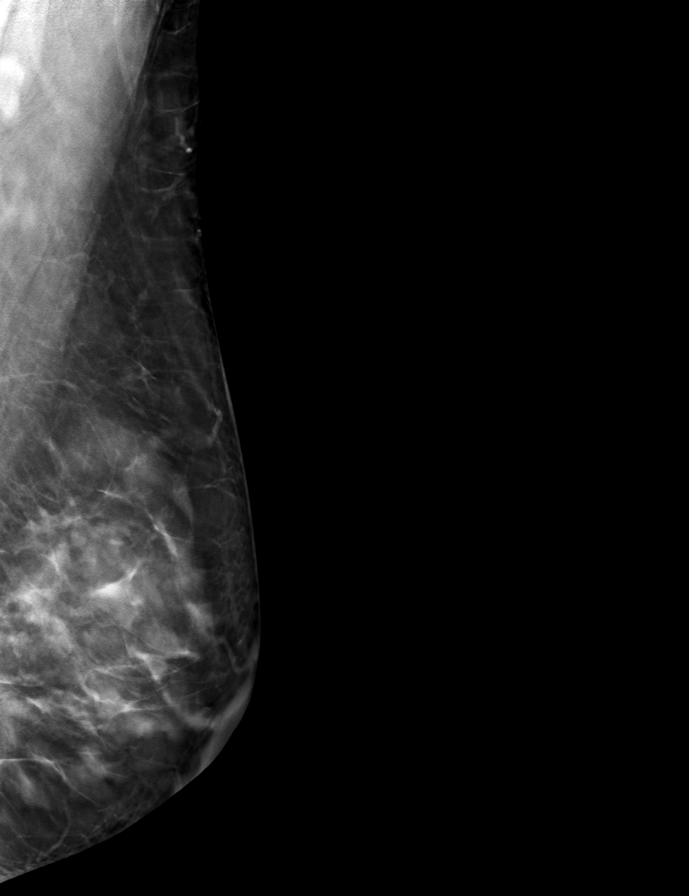

[R MLO tomo · tomo slice 39/78.0]
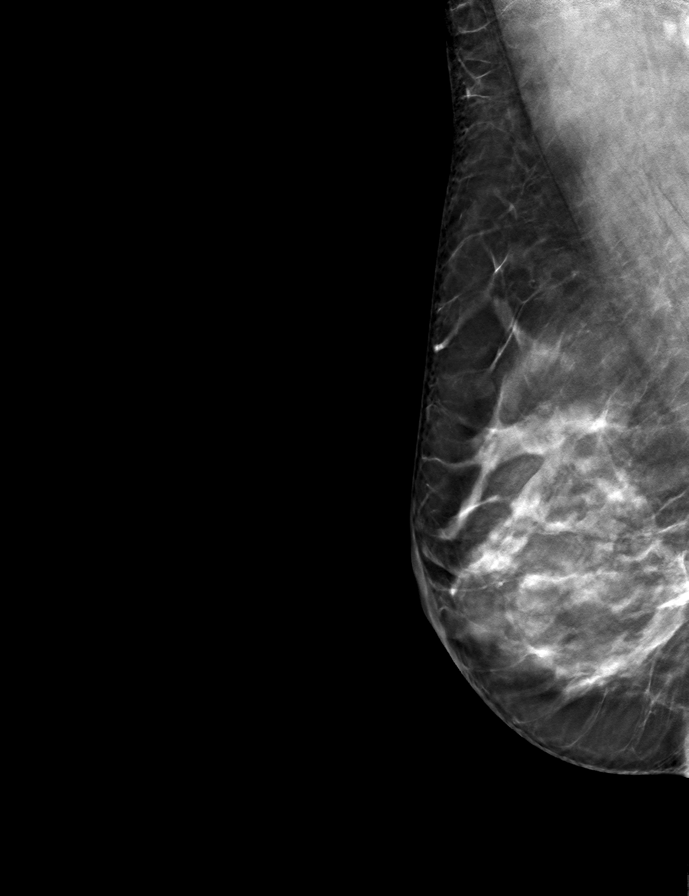

[R CC tomo · tomo slice 35/69.0]
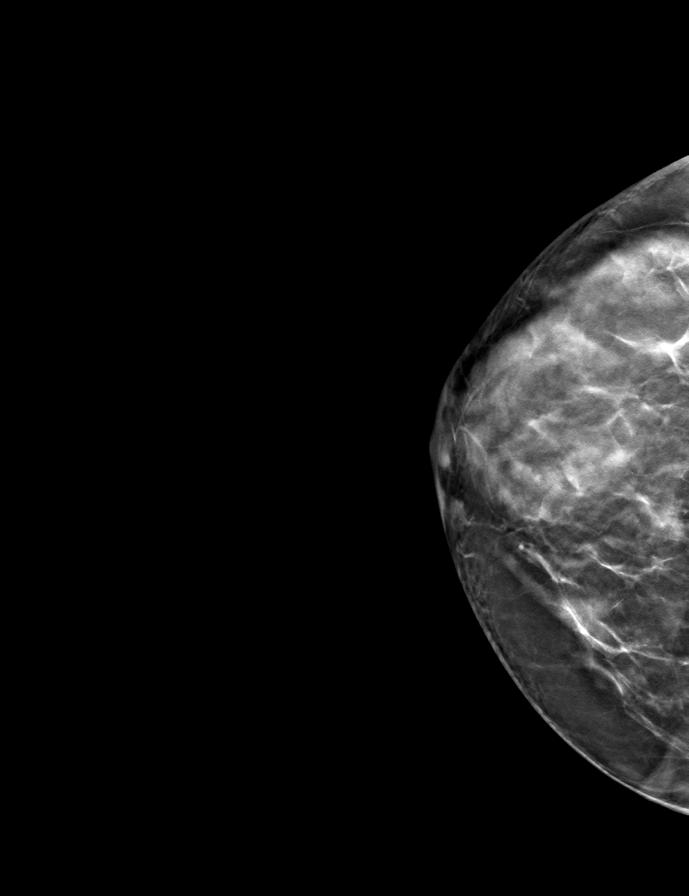

[L CC tomo · tomo slice 42/83.0]
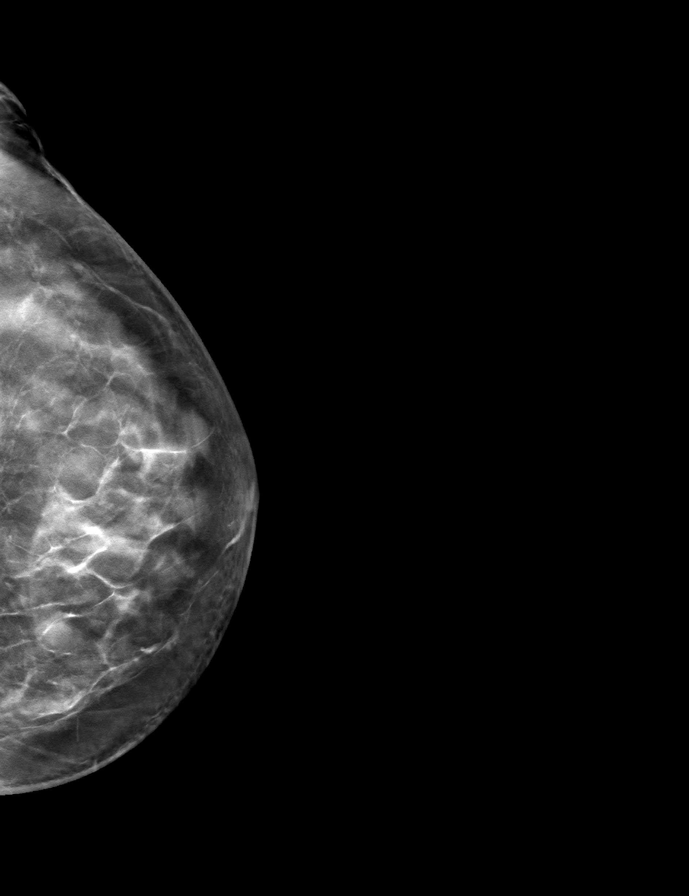

[9 of 24 positions shown; findings below may reference images not displayed]

ACR Breast Density Category c: The breast tissue is heterogeneously
dense, which may obscure small masses.
FINDINGS: There are no findings suspicious for malignancy.
IMPRESSION: No mammographic evidence of malignancy. A result letter of this
screening mammogram will be mailed directly to the patient.

RECOMMENDATION:
Screening mammogram in one year. (Code:Q3-W-BC3)

BI-RADS CATEGORY  1: Negative.

## 2023-07-03 DIAGNOSIS — J069 Acute upper respiratory infection, unspecified: Secondary | ICD-10-CM | POA: Diagnosis not present

## 2023-10-02 ENCOUNTER — Other Ambulatory Visit: Payer: Self-pay | Admitting: Family Medicine

## 2023-10-02 DIAGNOSIS — Z1231 Encounter for screening mammogram for malignant neoplasm of breast: Secondary | ICD-10-CM

## 2023-11-06 ENCOUNTER — Encounter: Payer: Self-pay | Admitting: Pediatrics

## 2023-11-12 ENCOUNTER — Ambulatory Visit

## 2023-11-19 DIAGNOSIS — H11153 Pinguecula, bilateral: Secondary | ICD-10-CM | POA: Diagnosis not present

## 2023-11-25 ENCOUNTER — Other Ambulatory Visit: Payer: Self-pay | Admitting: Medical Genetics

## 2023-11-25 ENCOUNTER — Ambulatory Visit
Admission: RE | Admit: 2023-11-25 | Discharge: 2023-11-25 | Disposition: A | Discharge status: Home / Self Care | Source: Ambulatory Visit | Attending: Family Medicine | Admitting: Family Medicine

## 2023-11-25 DIAGNOSIS — Z1231 Encounter for screening mammogram for malignant neoplasm of breast: Secondary | ICD-10-CM

## 2023-12-03 ENCOUNTER — Encounter: Payer: Self-pay | Admitting: Pediatrics

## 2023-12-03 ENCOUNTER — Ambulatory Visit (AMBULATORY_SURGERY_CENTER)

## 2023-12-03 VITALS — Ht 65.0 in | Wt 155.0 lb

## 2023-12-03 DIAGNOSIS — Z1211 Encounter for screening for malignant neoplasm of colon: Secondary | ICD-10-CM

## 2023-12-03 MED ORDER — NA SULFATE-K SULFATE-MG SULF 17.5-3.13-1.6 GM/177ML PO SOLN: 1.0000 | Freq: Once | ORAL | 0 refills | Status: AC

## 2023-12-03 NOTE — Progress Notes (Signed)

## 2023-12-17 NOTE — Progress Notes (Unsigned)
 [facility] History and Physical   Primary Care Physician:  Cleotilde Planas, MD   Reason for Procedure:  Colorectal cancer screening  Plan:    Screening colonoscopy     HPI: Lisa Walsh is a 53 y.o. female undergoing screening colonoscopy for colorectal cancer screening.  This is the patient's first colonoscopy.  No documented family history of colorectal cancer or polyps.  Patient denies current change in bowel habits or rectal bleeding.   Past Medical History:  Diagnosis Date   Abnormal Pap smear of vagina    high risk of HPV   Anticardiolipin antibody positive    Bradycardia    FHx: factor V Leiden mutation    with PEs, DVTs, hers was neg   H/O colposcopy with cervical biopsy    CIN 1 dated 9.14.06, normal pap since   Hemorrhoids    Low serum vitamin D     9/13   Pelvic pain in female     Past Surgical History:  Procedure Laterality Date   SHOULDER SURGERY      Prior to Admission medications   Medication Sig Start Date End Date Taking? Authorizing Provider  ADDERALL XR 30 MG 24 hr capsule Take 60 mg by mouth daily. 03/25/22   [provider]  TESTOSTERONE  NA Place into the nose every 3 (three) months. Pellets    [provider]    Current Outpatient Medications  Medication Sig Dispense Refill   ADDERALL XR 30 MG 24 hr capsule Take 60 mg by mouth daily.     TESTOSTERONE  NA Place into the nose every 3 (three) months. Pellets     No current facility-administered medications for this visit.    Allergies as of 12/19/2023   (No Known Allergies)    Family History  Problem Relation Age of Onset   Diabetes Father    Hyperlipidemia Father    Mental retardation Father    Hypertension Father    Breast cancer Maternal Aunt        31's   Colon cancer Neg Hx    Rectal cancer Neg Hx    Stomach cancer Neg Hx     Social History   Socioeconomic History   Marital status: Married    Spouse name: Not on file   Number of children:  Not on file   Years of education: Not on file   Highest education level: Not on file  Occupational History   Not on file  Tobacco Use   Smoking status: Never   Smokeless tobacco: Never  Vaping Use   Vaping status: Never Used  Substance and Sexual Activity   Alcohol use: Yes    Comment: 4-5 drinks a week    Drug use: Never   Sexual activity: Yes    Partners: Female    Comment: 1st intercourse- 44, partners- 6, married- 5 yrs   Other Topics Concern   Not on file  Social History Narrative   Not on file   Social Drivers of Health   Financial Resource Strain: Not on file  Food Insecurity: Not on file  Transportation Needs: Not on file  Physical Activity: Not on file  Stress: Not on file  Social Connections: Not on file  Intimate Partner Violence: Not on file    Review of Systems:  All other review of systems negative except as mentioned in the HPI.  Physical Exam: Vital signs There were no vitals taken for this visit.  General:   Alert,  Well-developed, well-nourished, pleasant  and cooperative in NAD Airway:  Mallampati  Lungs:  Clear throughout to auscultation.   Heart:  Regular rate and rhythm; no murmurs, clicks, rubs,  or gallops. Abdomen:  Soft, nontender and nondistended. Normal bowel sounds.   Neuro/Psych:  Normal mood and affect. A and O x 3  Inocente Hausen, MD [facility]

## 2023-12-19 ENCOUNTER — Ambulatory Visit: Admitting: Pediatrics

## 2023-12-19 ENCOUNTER — Encounter: Payer: Self-pay | Admitting: Pediatrics

## 2023-12-19 VITALS — BP 114/82 | HR 47 | Temp 97.3°F | Resp 11 | Ht 65.0 in | Wt 155.0 lb

## 2023-12-19 DIAGNOSIS — Z1211 Encounter for screening for malignant neoplasm of colon: Secondary | ICD-10-CM | POA: Diagnosis not present

## 2023-12-19 DIAGNOSIS — K648 Other hemorrhoids: Secondary | ICD-10-CM | POA: Diagnosis not present

## 2023-12-19 DIAGNOSIS — K644 Residual hemorrhoidal skin tags: Secondary | ICD-10-CM

## 2023-12-19 MED ORDER — SODIUM CHLORIDE 0.9 % IV SOLN: 500.0000 mL | Freq: Once | INTRAVENOUS | Status: DC

## 2023-12-19 NOTE — Patient Instructions (Signed)
Handout on hemorrhoids provided.  YOU HAD AN ENDOSCOPIC PROCEDURE TODAY AT THE [facility]:   Refer to the procedure report that was given to you for any specific questions about what was found during the examination.  If the procedure report does not answer your questions, please call your gastroenterologist to clarify.  If you requested that your care partner not be given the details of your procedure findings, then the procedure report has been included in a sealed envelope for you to review at your convenience later.  YOU SHOULD EXPECT: Some feelings of bloating in the abdomen. Passage of more gas than usual.  Walking can help get rid of the air that was put into your GI tract during the procedure and reduce the bloating. If you had a lower endoscopy (such as a colonoscopy or flexible sigmoidoscopy) you may notice spotting of blood in your stool or on the toilet paper. If you underwent a bowel prep for your procedure, you may not have a normal bowel movement for a few days.  Please Note:  You might notice some irritation and congestion in your nose or some drainage.  This is from the oxygen used during your procedure.  There is no need for concern and it should clear up in a day or so.  SYMPTOMS TO REPORT IMMEDIATELY:  Following lower endoscopy (colonoscopy or flexible sigmoidoscopy):  Excessive amounts of blood in the stool  Significant tenderness or worsening of abdominal pains  Swelling of the abdomen that is new, acute  Fever of 100F or higher   For urgent or emergent issues, a gastroenterologist can be reached at any hour by calling [phone]. Do not use MyChart messaging for urgent concerns.    DIET:  We do recommend a small meal at first, but then you may proceed to your regular diet.  Drink plenty of fluids but you should avoid alcoholic beverages for 24 hours.  ACTIVITY:  You should plan to take it easy for the rest of today and you should NOT DRIVE or use heavy  machinery until tomorrow (because of the sedation medicines used during the test).    FOLLOW UP: Our staff will call the number listed on your records the next business day following your procedure.  We will call around 7:15- 8:00 am to check on you and address any questions or concerns that you may have regarding the information given to you following your procedure. If we do not reach you, we will leave a message.     If any biopsies were taken you will be contacted by phone or by letter within the next 1-3 weeks.  Please call us at [phone] if you have not heard about the biopsies in 3 weeks.    SIGNATURES/CONFIDENTIALITY: You and/or your care partner have signed paperwork which will be entered into your electronic medical record.  These signatures attest to the fact that that the information above on your After Visit Summary has been reviewed and is understood.  Full responsibility of the confidentiality of this discharge information lies with you and/or your care-partner.  

## 2023-12-19 NOTE — Progress Notes (Signed)
 [id number] Patient experiencing nausea and retching.  MD updated and Zofran 4 mg IV given, vss

## 2023-12-19 NOTE — Progress Notes (Signed)
 Report given to PACU, vss

## 2023-12-19 NOTE — Op Note (Signed)
 [facility] Patient Name: Lisa Walsh Procedure Date: 12/19/2023 7:21 AM MRN: [id number] Endoscopist: Inocente Hausen , MD, [id number] Age: 53 Referring MD:  Date of Birth: 10-30-1970 Gender: Female Account #: [id number] Procedure:                Colonoscopy Indications:              Screening for colorectal malignant neoplasm, This                            is the patient's first colonoscopy Medicines:                Monitored Anesthesia Care Procedure:                Pre-Anesthesia Assessment:                           - Prior to the procedure, a History and Physical                            was performed, and patient medications and                            allergies were reviewed. The patient's tolerance of                            previous anesthesia was also reviewed. The risks                            and benefits of the procedure and the sedation                            options and risks were discussed with the patient.                            All questions were answered, and informed consent                            was obtained. Prior Anticoagulants: The patient has                            taken no anticoagulant or antiplatelet agents. ASA                            Grade Assessment: I - A normal, healthy patient.                            After reviewing the risks and benefits, the patient                            was deemed in satisfactory condition to undergo the                            procedure.  After obtaining informed consent, the colonoscope                            was passed under direct vision. Throughout the                            procedure, the patient's blood pressure, pulse, and                            oxygen saturations were monitored continuously. The                            Olympus Scope SN: [id number] was introduced through                            the anus and advanced to the cecum,  identified by                            appendiceal orifice and ileocecal valve. The                            colonoscopy was performed without difficulty. The                            patient tolerated the procedure well. The quality                            of the bowel preparation was good. The ileocecal                            valve, appendiceal orifice, and rectum were                            photographed. Scope In: 8:41:09 AM Scope Out: 9:06:42 AM Scope Withdrawal Time: 0 hours 9 minutes 27 seconds  Total Procedure Duration: 0 hours 25 minutes 33 seconds  Findings:                 Hemorrhoids were found on perianal exam.                           The digital rectal exam was normal. Pertinent                            negatives include normal sphincter tone and no                            palpable rectal lesions.                           The colon (entire examined portion) appeared normal.                           Internal hemorrhoids were found during retroflexion. Complications:            No  immediate complications. Estimated blood loss:                            None. Estimated Blood Loss:     Estimated blood loss: none. Impression:               - Hemorrhoids found on perianal exam.                           - The entire examined colon is normal.                           - Internal hemorrhoids.                           - No specimens collected. Recommendation:           - Discharge patient to home (ambulatory).                           - Repeat colonoscopy in 10 years for screening                            purposes.                           - The findings and recommendations were discussed                            with the patient's family.                           - Return to referring physician.                           - Patient has a contact number available for                            emergencies. The signs and symptoms of potential                             delayed complications were discussed with the                            patient. Return to normal activities tomorrow.                            Written discharge instructions were provided to the                            patient. Inocente Hausen, MD 12/19/2023 9:12:52 AM This report has been signed electronically.

## 2023-12-19 NOTE — Progress Notes (Signed)
 Pt's states no medical or surgical changes since previsit or office visit.

## 2023-12-20 ENCOUNTER — Telehealth: Payer: Self-pay | Admitting: Lactation Services

## 2023-12-20 NOTE — Telephone Encounter (Signed)
 No answer left voice mail

## 2024-02-06 DIAGNOSIS — L570 Actinic keratosis: Secondary | ICD-10-CM | POA: Diagnosis not present

## 2024-02-06 DIAGNOSIS — L814 Other melanin hyperpigmentation: Secondary | ICD-10-CM | POA: Diagnosis not present

## 2024-02-06 DIAGNOSIS — L821 Other seborrheic keratosis: Secondary | ICD-10-CM | POA: Diagnosis not present

## 2024-04-21 ENCOUNTER — Other Ambulatory Visit: Payer: Self-pay | Admitting: Medical Genetics

## 2024-04-21 DIAGNOSIS — Z006 Encounter for examination for normal comparison and control in clinical research program: Secondary | ICD-10-CM

## 2024-06-30 ENCOUNTER — Ambulatory Visit: Admitting: Obstetrics and Gynecology

## 2024-06-30 ENCOUNTER — Encounter: Payer: Self-pay | Admitting: Obstetrics and Gynecology

## 2024-06-30 ENCOUNTER — Other Ambulatory Visit (HOSPITAL_COMMUNITY)
Admission: RE | Admit: 2024-06-30 | Discharge: 2024-06-30 | Disposition: A | Source: Ambulatory Visit | Attending: Obstetrics and Gynecology | Admitting: Obstetrics and Gynecology

## 2024-06-30 VITALS — BP 118/78 | HR 67 | Ht 67.0 in | Wt 158.0 lb

## 2024-06-30 DIAGNOSIS — Z01419 Encounter for gynecological examination (general) (routine) without abnormal findings: Secondary | ICD-10-CM

## 2024-06-30 DIAGNOSIS — R102 Pelvic and perineal pain unspecified side: Secondary | ICD-10-CM | POA: Diagnosis not present

## 2024-06-30 DIAGNOSIS — Z1331 Encounter for screening for depression: Secondary | ICD-10-CM | POA: Diagnosis not present

## 2024-06-30 DIAGNOSIS — E2839 Other primary ovarian failure: Secondary | ICD-10-CM | POA: Diagnosis not present

## 2024-06-30 NOTE — Progress Notes (Signed)
 "   54 y.o. y.o. female here for annual exam. Patient's last menstrual period was 05/28/2022.   No PMB  G0 same sex partner, Lisa Walsh.  Patient is a product/process development scientist, doing very well.     RP: Established patient presenting for annual gyn exam    HPI:  Postmenopause, well on no HRT.  No PMB.  No pelvic pain.  Fatigue, but no hot flashes and no night sweats.  Seen by Integrative MD, on Testo pellet, but decided to stop.  Started the pellets again. Pap Neg 05/2023 Neg.   Breasts normal.  Screening mammo Neg 11/2023. Hypothyroidism with normal TSH on [place] thyroid . Health labs done with Fam MD.  Exercises regularly.  Body mass index 26.46 at last visit.  Colon: 2025 Dxa: referral placed Body mass index is 24.75 kg/m.     06/30/2024   10:42 AM 05/14/2016    8:06 AM  Depression screen PHQ 2/9  Decreased Interest 0 0  Down, Depressed, Hopeless 0 0  PHQ - 2 Score 0 0    Blood pressure 118/78, pulse 67, height 5' 7 (1.702 m), weight 158 lb (71.7 kg), last menstrual period 05/28/2022, SpO2 99%.     Component Value Date/Time   DIAGPAP  06/14/2023 1535    - Negative for intraepithelial lesion or malignancy (NILM)   DIAGPAP  06/05/2021 1147    - Negative for intraepithelial lesion or malignancy (NILM)   HPVHIGH Negative 06/14/2023 1535   ADEQPAP  06/14/2023 1535    Satisfactory for evaluation. The presence or absence of an   ADEQPAP  06/14/2023 1535    endocervical/transformation zone component cannot be determined because   ADEQPAP of atrophy. 06/14/2023 1535    GYN HISTORY:    Component Value Date/Time   DIAGPAP  06/14/2023 1535    - Negative for intraepithelial lesion or malignancy (NILM)   DIAGPAP  06/05/2021 1147    - Negative for intraepithelial lesion or malignancy (NILM)   HPVHIGH Negative 06/14/2023 1535   ADEQPAP  06/14/2023 1535    Satisfactory for evaluation. The presence or absence of an   ADEQPAP  06/14/2023 1535    endocervical/transformation zone component  cannot be determined because   ADEQPAP of atrophy. 06/14/2023 1535    OB History  Gravida Para Term Preterm AB Living  0 0 0 0 0 0  SAB IAB Ectopic Multiple Live Births  0 0 0 0 0    Past Medical History:  Diagnosis Date   Abnormal Pap smear of vagina    high risk of HPV   Anticardiolipin antibody positive    Bradycardia    FHx: factor V Leiden mutation    with PEs, DVTs, hers was neg   H/O colposcopy with cervical biopsy    CIN 1 dated 9.14.06, normal pap since   Hemorrhoids    Low serum vitamin D     9/13   Pelvic pain in female     Past Surgical History:  Procedure Laterality Date   SHOULDER SURGERY      Medications Ordered Prior to Encounter[1]  Social History   Socioeconomic History   Marital status: Married    Spouse name: Not on file   Number of children: Not on file   Years of education: Not on file   Highest education level: Not on file  Occupational History   Not on file  Tobacco Use   Smoking status: Never   Smokeless tobacco: Never  Vaping Use   Vaping status: Never  Used  Substance and Sexual Activity   Alcohol use: Yes    Comment: 4-5 drinks a week    Drug use: Never   Sexual activity: Yes    Partners: Female    Comment: 1st intercourse- 89, partners- 6, married- 5 yrs   Other Topics Concern   Not on file  Social History Narrative   Not on file   Social Drivers of Health   Tobacco Use: Low Risk (06/30/2024)   Patient History    Smoking Tobacco Use: Never    Smokeless Tobacco Use: Never    Passive Exposure: Not on file  Financial Resource Strain: Not on file  Food Insecurity: Not on file  Transportation Needs: Not on file  Physical Activity: Not on file  Stress: Not on file  Social Connections: Not on file  Intimate Partner Violence: Not on file  Depression (PHQ2-9): Low Risk (06/30/2024)   Depression (PHQ2-9)    PHQ-2 Score: 0  Alcohol Screen: Not on file  Housing: Not on file  Utilities: Not on file  Health Literacy: Not on  file    Family History  Problem Relation Age of Onset   Diabetes Father    Hyperlipidemia Father    Mental retardation Father    Hypertension Father    Breast cancer Maternal Aunt        25's   Colon cancer Neg Hx    Rectal cancer Neg Hx    Stomach cancer Neg Hx      Allergies[2]    Patient's last menstrual period was Patient's last menstrual period was 05/28/2022.Lisa Walsh             Review of Systems Alls systems reviewed and are negative.     Physical Exam Constitutional:      Appearance: Normal appearance.  Genitourinary:     Vulva and urethral meatus normal.     No lesions in the vagina.     Right Labia: No rash, lesions or skin changes.    Left Labia: No lesions, skin changes or rash.    No vaginal discharge or tenderness.     No vaginal prolapse present.    Moderate vaginal atrophy present.     Right Adnexa: not tender, not palpable and no mass present.    Left Adnexa: not tender, not palpable and no mass present.    No cervical motion tenderness or discharge.     Uterus is not enlarged, tender or irregular.  Breasts:    Right: Normal.     Left: Normal.  HENT:     Head: Normocephalic.  Neck:     Thyroid : No thyroid  mass, thyromegaly or thyroid  tenderness.  Cardiovascular:     Rate and Rhythm: Normal rate and regular rhythm.     Heart sounds: Normal heart sounds, S1 normal and S2 normal.  Pulmonary:     Effort: Pulmonary effort is normal.     Breath sounds: Normal breath sounds and air entry.  Abdominal:     General: There is no distension.     Palpations: Abdomen is soft. There is no mass.     Tenderness: There is no abdominal tenderness. There is no guarding or rebound.  Musculoskeletal:        General: Normal range of motion.     Cervical back: Full passive range of motion without pain, normal range of motion and neck supple. No tenderness.     Right lower leg: No edema.     Left lower leg: No  edema.  Neurological:     Mental Status: She is alert.   Skin:    General: Skin is warm.  Psychiatric:        Mood and Affect: Mood normal.        Behavior: Behavior normal.        Thought Content: Thought content normal.  Vitals and nursing note reviewed. Exam conducted with a chaperone present.       A:         Well Woman GYN exam                             P:        Pap smear collected today Encouraged annual mammogram screening Colon cancer screening up-to-date DXA ordered today Labs and immunizations to do with PMD Discussed breast self exams Encouraged healthy lifestyle practices Encouraged Vit D and Calcium   No follow-ups on file.  Lisa Walsh      [1]  Current Outpatient Medications on File Prior to Visit  Medication Sig Dispense Refill   ADDERALL XR 30 MG 24 hr capsule Take 60 mg by mouth daily.     TESTOSTERONE  NA by Other route every 3 (three) months. Pellets.... via incision in the RUOQ.     No current facility-administered medications on file prior to visit.  [2] No Known Allergies  "

## 2024-06-30 NOTE — Progress Notes (Signed)
 No questions or concerns at this time

## 2024-06-30 NOTE — Patient Instructions (Signed)
 Perimenopause/Menopause suggestions   You should be getting 1,200 mg of calcium a day between your diet and supplements and at least 3000 IU a day of Vit D. You should exercise regularly with weight bearing exercises. I would recommend a bone density q 2 years.  We loose 5% per year in this time period of our bone density, due to the loss of estrogen. Magnesium at bedtime for our sleep and bones (follow recommended dose on bottle)  Please read The New Menopause my Dr. Ronal Estefana Morton. Estrogen Matters  Try to avoid caffeine and alcohol in this period, as they can make symptoms worse  Continue annual mammograms, unless told sooner  Please reach out with any questions Lisa Walsh   Locations you can schedule your bone scan are:  The [facility] bone scan location scheduling line is [phone]  [facility] is [phone]  [facility] radiology [phone]   [facility] [phone]  [facility]: [phone]    Another option if they are behind is [facility] and their number is [phone].  I can send the referral if you want to go there.    Please let me know if you have any trouble scheduling. This is important for management of osteoporosis or osteopenia.   I can sit down with you after the scan to discuss results and treatment.  Dr. Carpen

## 2024-07-06 ENCOUNTER — Ambulatory Visit: Payer: Self-pay | Admitting: Obstetrics and Gynecology

## 2024-07-06 LAB — CYTOLOGY - PAP: Diagnosis: NEGATIVE

## 2024-07-22 ENCOUNTER — Ambulatory Visit (INDEPENDENT_AMBULATORY_CARE_PROVIDER_SITE_OTHER)

## 2024-07-22 ENCOUNTER — Other Ambulatory Visit

## 2024-07-22 ENCOUNTER — Other Ambulatory Visit: Admitting: Obstetrics and Gynecology

## 2024-07-22 ENCOUNTER — Ambulatory Visit: Admitting: Obstetrics and Gynecology

## 2024-07-22 ENCOUNTER — Encounter: Payer: Self-pay | Admitting: Obstetrics and Gynecology

## 2024-07-22 VITALS — BP 112/68 | HR 73 | Ht 65.95 in | Wt 160.2 lb

## 2024-07-22 DIAGNOSIS — D219 Benign neoplasm of connective and other soft tissue, unspecified: Secondary | ICD-10-CM

## 2024-07-22 DIAGNOSIS — R350 Frequency of micturition: Secondary | ICD-10-CM

## 2024-07-22 DIAGNOSIS — R935 Abnormal findings on diagnostic imaging of other abdominal regions, including retroperitoneum: Secondary | ICD-10-CM | POA: Diagnosis not present

## 2024-07-22 DIAGNOSIS — R102 Pelvic and perineal pain unspecified side: Secondary | ICD-10-CM | POA: Diagnosis not present

## 2024-07-22 DIAGNOSIS — N950 Postmenopausal bleeding: Secondary | ICD-10-CM

## 2024-07-22 DIAGNOSIS — E2839 Other primary ovarian failure: Secondary | ICD-10-CM

## 2024-07-22 MED ORDER — INTRAROSA 6.5 MG VA INST: 1.0000 | VAGINAL_INSERT | Freq: Every evening | VAGINAL | 12 refills | Status: AC | PRN

## 2024-07-22 NOTE — Progress Notes (Signed)
 "  Acute Office Visit  Subjective:    Patient ID: Lisa Walsh, female    DOB: 02-06-1971, 54 y.o.   MRN: [id number]   HPI 54 y.o. presents today for No chief complaint on file. [name] Here today for PUS results Patient's last menstrual period was 05/28/2022.   Illicits urinary symptoms with the multiple fibroids Bled twice when trying to start estrogen and heavily. Only on testosterone  now, but will be coming here for management and provider will not start back on it with the bleeding. Stopped periods 3 years ago. History of having to repeat her pap smears in the past on several occasions Does not want to worry about bleeding and the fibroids in the future. Has noticed more fatigue lately Would like to have estrogen in the future Has dyspareunia Review of Systems     Objective:    OBGyn Exam  LMP 05/28/2022 Comment: female partner no contraception Wt Readings from Last 3 Encounters:  06/30/24 158 lb (71.7 kg)  12/19/23 155 lb (70.3 kg)  12/03/23 155 lb (70.3 kg)          Component Value Date/Time   DIAGPAP  06/30/2024 1552    - Negative for intraepithelial lesion or malignancy (NILM)   DIAGPAP  06/14/2023 1535    - Negative for intraepithelial lesion or malignancy (NILM)   DIAGPAP  06/05/2021 1147    - Negative for intraepithelial lesion or malignancy (NILM)   HPVHIGH Negative 06/14/2023 1535   ADEQPAP  06/30/2024 1552    Satisfactory for evaluation; transformation zone component PRESENT.   ADEQPAP  06/14/2023 1535    Satisfactory for evaluation. The presence or absence of an   ADEQPAP  06/14/2023 1535    endocervical/transformation zone component cannot be determined because   ADEQPAP of atrophy. 06/14/2023 1535     PUS results 6.55cm uterus with multiple fibroids 7 total measured: 0.79, 1.03, 0.55, 0.43, 1.15, 3.47cm Endometrial lining at 5.34mm  Both ovaries with normal perfussion No adnexal masses No free fluid  Past Medical History:  Diagnosis Date    Abnormal Pap smear of vagina    high risk of HPV   Anticardiolipin antibody positive    Bradycardia    FHx: factor V Leiden mutation    with PEs, DVTs, hers was neg   H/O colposcopy with cervical biopsy    CIN 1 dated 9.14.06, normal pap since   Hemorrhoids    Low serum vitamin D     9/13   Pelvic pain in female    OB History     Gravida  0   Para  0   Term  0   Preterm  0   AB  0   Living  0      SAB  0   IAB  0   Ectopic  0   Multiple  0   Live Births  0          Past Surgical History:  Procedure Laterality Date   SHOULDER SURGERY     Social History   Socioeconomic History   Marital status: Married    Spouse name: Not on file   Number of children: Not on file   Years of education: Not on file   Highest education level: Not on file  Occupational History   Not on file  Tobacco Use   Smoking status: Never   Smokeless tobacco: Never  Vaping Use   Vaping status: Never Used  Substance and Sexual Activity  Alcohol use: Yes    Comment: 4-5 drinks a week    Drug use: Never   Sexual activity: Yes    Partners: Female    Comment: 1st intercourse- 64, partners- 6, married- 5 yrs   Other Topics Concern   Not on file  Social History Narrative   Not on file   Social Drivers of Health   Tobacco Use: Low Risk (07/22/2024)   Patient History    Smoking Tobacco Use: Never    Smokeless Tobacco Use: Never    Passive Exposure: Not on file  Financial Resource Strain: Not on file  Food Insecurity: Not on file  Transportation Needs: Not on file  Physical Activity: Not on file  Stress: Not on file  Social Connections: Not on file  Depression (PHQ2-9): Low Risk (06/30/2024)   Depression (PHQ2-9)    PHQ-2 Score: 0  Alcohol Screen: Not on file  Housing: Not on file  Utilities: Not on file  Health Literacy: Not on file     Assessment & Plan:  H/o PMB while trying Estrogen HRT in the past and bled heavily Endometrial lining at upper limites of  normal Multiple fibroids Urinary symptoms Remote abnormal pap smears  Options discussed: conservation with repeat PUS 2 month, 6 months to follow fibroids and lining. If any change, do EMB and possible [facility] Discussed option with [facility]. Will need EMB prior to this Discussed option with EMB and repeat PUS in 6 months. Discussed possible need for repeat procedures with any pain or vaginal bleeding. Reviewed the [facility] and EMB in detail and what to expect for the procedure and after. She would like to schedule this. Encouraged patient to take motrin an hour before coming. May have cramping. Surgery request placed PUS results printed and reviewed with patient. Begin intrarosa  now for dyspareunia and cuff healing after surgery. Will transition to this off the pellets  30 minutes spent on reviewing records, imaging,  and one on one patient time and counseling patient and documentation Dr. Glennon Almarie MARLA Glennon "

## 2024-08-04 ENCOUNTER — Ambulatory Visit: Admitting: Obstetrics and Gynecology

## 2025-07-01 ENCOUNTER — Ambulatory Visit: Admitting: Obstetrics and Gynecology
